# Patient Record
Sex: Female | Born: 1975 | Race: White | Hispanic: No | Marital: Single | Smoking: Never smoker
Health system: Southern US, Community
[De-identification: ages and names within clinical notes are randomized; demographics above are authoritative.]

## PROBLEM LIST (undated history)

## (undated) DIAGNOSIS — Z5189 Encounter for other specified aftercare: Secondary | ICD-10-CM

## (undated) DIAGNOSIS — E119 Type 2 diabetes mellitus without complications: Secondary | ICD-10-CM

## (undated) DIAGNOSIS — R011 Cardiac murmur, unspecified: Secondary | ICD-10-CM

## (undated) DIAGNOSIS — N186 End stage renal disease: Secondary | ICD-10-CM

## (undated) DIAGNOSIS — H269 Unspecified cataract: Secondary | ICD-10-CM

## (undated) DIAGNOSIS — D649 Anemia, unspecified: Secondary | ICD-10-CM

## (undated) DIAGNOSIS — F419 Anxiety disorder, unspecified: Secondary | ICD-10-CM

## (undated) DIAGNOSIS — T7840XA Allergy, unspecified, initial encounter: Secondary | ICD-10-CM

## (undated) DIAGNOSIS — R569 Unspecified convulsions: Secondary | ICD-10-CM

## (undated) DIAGNOSIS — Z992 Dependence on renal dialysis: Secondary | ICD-10-CM

## (undated) DIAGNOSIS — I10 Essential (primary) hypertension: Secondary | ICD-10-CM

## (undated) HISTORY — DX: Unspecified cataract: H26.9

## (undated) HISTORY — DX: Type 2 diabetes mellitus without complications: E11.9

## (undated) HISTORY — DX: Unspecified convulsions: R56.9

## (undated) HISTORY — DX: Cardiac murmur, unspecified: R01.1

## (undated) HISTORY — DX: Anxiety disorder, unspecified: F41.9

## (undated) HISTORY — DX: Allergy, unspecified, initial encounter: T78.40XA

---

## 1997-03-18 HISTORY — PX: OTHER SURGICAL HISTORY: SHX169

## 1999-03-19 HISTORY — PX: FEMUR SURGERY: SHX943

## 2004-02-02 ENCOUNTER — Emergency Department (HOSPITAL_COMMUNITY): Admission: EM | Admit: 2004-02-02 | Discharge: 2004-02-02 | Payer: Self-pay | Admitting: Emergency Medicine

## 2004-03-18 HISTORY — PX: WRIST SURGERY: SHX841

## 2004-09-07 ENCOUNTER — Ambulatory Visit: Payer: Self-pay | Admitting: Orthopaedic Surgery

## 2005-08-10 ENCOUNTER — Emergency Department: Payer: Self-pay | Admitting: Emergency Medicine

## 2006-05-24 ENCOUNTER — Emergency Department: Payer: Self-pay

## 2006-05-24 ENCOUNTER — Other Ambulatory Visit: Payer: Self-pay

## 2006-09-04 ENCOUNTER — Ambulatory Visit: Payer: Self-pay | Admitting: Gynecology

## 2006-09-09 ENCOUNTER — Ambulatory Visit: Payer: Self-pay | Admitting: Family Medicine

## 2006-09-09 ENCOUNTER — Encounter: Payer: Self-pay | Admitting: Family Medicine

## 2006-09-15 ENCOUNTER — Ambulatory Visit (HOSPITAL_COMMUNITY): Admission: RE | Admit: 2006-09-15 | Discharge: 2006-09-15 | Payer: Self-pay | Admitting: Family Medicine

## 2006-09-22 ENCOUNTER — Ambulatory Visit: Payer: Self-pay | Admitting: Gynecology

## 2006-09-22 ENCOUNTER — Encounter: Admission: RE | Admit: 2006-09-22 | Discharge: 2006-09-22 | Payer: Self-pay | Admitting: Family Medicine

## 2006-09-29 ENCOUNTER — Ambulatory Visit (HOSPITAL_COMMUNITY): Admission: RE | Admit: 2006-09-29 | Discharge: 2006-09-29 | Payer: Self-pay | Admitting: Family Medicine

## 2006-09-30 ENCOUNTER — Ambulatory Visit: Payer: Self-pay | Admitting: Family Medicine

## 2006-10-06 ENCOUNTER — Ambulatory Visit: Payer: Self-pay | Admitting: Gynecology

## 2006-10-20 ENCOUNTER — Ambulatory Visit (HOSPITAL_COMMUNITY): Admission: RE | Admit: 2006-10-20 | Discharge: 2006-10-20 | Payer: Self-pay | Admitting: Family Medicine

## 2006-10-27 ENCOUNTER — Ambulatory Visit: Payer: Self-pay | Admitting: Gynecology

## 2006-11-10 ENCOUNTER — Ambulatory Visit: Payer: Self-pay | Admitting: Gynecology

## 2006-11-24 ENCOUNTER — Ambulatory Visit (HOSPITAL_COMMUNITY): Admission: RE | Admit: 2006-11-24 | Discharge: 2006-11-24 | Payer: Self-pay | Admitting: Family Medicine

## 2006-11-25 ENCOUNTER — Ambulatory Visit: Payer: Self-pay | Admitting: Family Medicine

## 2006-12-01 ENCOUNTER — Ambulatory Visit: Payer: Self-pay | Admitting: Gynecology

## 2006-12-15 ENCOUNTER — Ambulatory Visit (HOSPITAL_COMMUNITY): Admission: RE | Admit: 2006-12-15 | Discharge: 2006-12-15 | Payer: Self-pay | Admitting: Family Medicine

## 2006-12-24 ENCOUNTER — Ambulatory Visit: Payer: Self-pay | Admitting: Obstetrics & Gynecology

## 2007-01-01 ENCOUNTER — Ambulatory Visit: Payer: Self-pay | Admitting: Family Medicine

## 2007-01-06 ENCOUNTER — Ambulatory Visit: Payer: Self-pay | Admitting: Family Medicine

## 2007-01-12 ENCOUNTER — Ambulatory Visit: Payer: Self-pay | Admitting: Gynecology

## 2007-01-14 ENCOUNTER — Ambulatory Visit: Payer: Self-pay | Admitting: Obstetrics & Gynecology

## 2007-01-20 ENCOUNTER — Ambulatory Visit (HOSPITAL_COMMUNITY): Admission: RE | Admit: 2007-01-20 | Discharge: 2007-01-20 | Payer: Self-pay | Admitting: Family Medicine

## 2007-01-22 ENCOUNTER — Inpatient Hospital Stay (HOSPITAL_COMMUNITY): Admission: AD | Admit: 2007-01-22 | Discharge: 2007-01-28 | Payer: Self-pay | Admitting: Gynecology

## 2007-01-22 ENCOUNTER — Ambulatory Visit: Payer: Self-pay | Admitting: *Deleted

## 2007-01-29 ENCOUNTER — Ambulatory Visit: Payer: Self-pay | Admitting: Obstetrics & Gynecology

## 2007-02-02 ENCOUNTER — Ambulatory Visit: Payer: Self-pay | Admitting: Obstetrics & Gynecology

## 2007-02-05 ENCOUNTER — Ambulatory Visit: Payer: Self-pay | Admitting: Obstetrics & Gynecology

## 2007-02-10 ENCOUNTER — Ambulatory Visit (HOSPITAL_COMMUNITY): Admission: RE | Admit: 2007-02-10 | Discharge: 2007-02-10 | Payer: Self-pay | Admitting: Gynecology

## 2007-02-11 ENCOUNTER — Ambulatory Visit: Payer: Self-pay | Admitting: Obstetrics & Gynecology

## 2007-02-17 ENCOUNTER — Ambulatory Visit: Payer: Self-pay | Admitting: Gynecology

## 2007-02-19 ENCOUNTER — Ambulatory Visit: Payer: Self-pay | Admitting: Obstetrics & Gynecology

## 2007-02-20 ENCOUNTER — Inpatient Hospital Stay (HOSPITAL_COMMUNITY): Admission: AD | Admit: 2007-02-20 | Discharge: 2007-02-27 | Payer: Self-pay | Admitting: Obstetrics & Gynecology

## 2007-02-20 ENCOUNTER — Ambulatory Visit: Payer: Self-pay | Admitting: Pulmonary Disease

## 2007-02-20 ENCOUNTER — Ambulatory Visit: Payer: Self-pay | Admitting: Cardiology

## 2007-02-20 ENCOUNTER — Ambulatory Visit: Payer: Self-pay | Admitting: Obstetrics & Gynecology

## 2007-02-21 ENCOUNTER — Encounter: Payer: Self-pay | Admitting: Obstetrics & Gynecology

## 2007-02-25 ENCOUNTER — Encounter: Payer: Self-pay | Admitting: Obstetrics & Gynecology

## 2007-03-19 HISTORY — PX: ELBOW SURGERY: SHX618

## 2007-04-08 ENCOUNTER — Ambulatory Visit: Payer: Self-pay | Admitting: Obstetrics & Gynecology

## 2007-04-08 ENCOUNTER — Encounter (INDEPENDENT_AMBULATORY_CARE_PROVIDER_SITE_OTHER): Payer: Self-pay | Admitting: Gynecology

## 2007-04-09 ENCOUNTER — Ambulatory Visit: Payer: Self-pay | Admitting: Obstetrics & Gynecology

## 2007-04-24 ENCOUNTER — Encounter: Payer: Self-pay | Admitting: Pulmonary Disease

## 2007-04-24 DIAGNOSIS — J189 Pneumonia, unspecified organism: Secondary | ICD-10-CM | POA: Insufficient documentation

## 2007-06-25 ENCOUNTER — Ambulatory Visit: Payer: Self-pay | Admitting: Nurse Practitioner

## 2007-08-19 ENCOUNTER — Ambulatory Visit: Payer: Self-pay | Admitting: Family Medicine

## 2008-01-29 ENCOUNTER — Ambulatory Visit: Payer: Self-pay | Admitting: Unknown Physician Specialty

## 2008-02-04 ENCOUNTER — Ambulatory Visit: Payer: Self-pay | Admitting: Unknown Physician Specialty

## 2008-05-13 ENCOUNTER — Ambulatory Visit: Payer: Self-pay | Admitting: Family Medicine

## 2009-01-27 ENCOUNTER — Inpatient Hospital Stay: Payer: Self-pay | Admitting: Internal Medicine

## 2009-03-18 HISTORY — PX: OTHER SURGICAL HISTORY: SHX169

## 2010-01-15 ENCOUNTER — Encounter: Admission: RE | Admit: 2010-01-15 | Discharge: 2010-01-15 | Payer: Self-pay | Admitting: Family Medicine

## 2010-03-18 HISTORY — PX: OTHER SURGICAL HISTORY: SHX169

## 2010-03-18 HISTORY — PX: REMOVAL OF A DIALYSIS CATHETER: SHX6053

## 2010-04-04 ENCOUNTER — Ambulatory Visit: Admit: 2010-04-04 | Payer: Self-pay | Admitting: Obstetrics & Gynecology

## 2010-04-07 ENCOUNTER — Encounter: Payer: Self-pay | Admitting: Family Medicine

## 2010-04-12 ENCOUNTER — Ambulatory Visit: Admit: 2010-04-12 | Payer: Self-pay | Admitting: Obstetrics & Gynecology

## 2010-06-18 ENCOUNTER — Encounter (HOSPITAL_BASED_OUTPATIENT_CLINIC_OR_DEPARTMENT_OTHER): Payer: BC Managed Care – PPO | Attending: Internal Medicine

## 2010-06-18 DIAGNOSIS — Z992 Dependence on renal dialysis: Secondary | ICD-10-CM | POA: Insufficient documentation

## 2010-06-18 DIAGNOSIS — E1142 Type 2 diabetes mellitus with diabetic polyneuropathy: Secondary | ICD-10-CM | POA: Insufficient documentation

## 2010-06-18 DIAGNOSIS — E1069 Type 1 diabetes mellitus with other specified complication: Secondary | ICD-10-CM | POA: Insufficient documentation

## 2010-06-18 DIAGNOSIS — E11319 Type 2 diabetes mellitus with unspecified diabetic retinopathy without macular edema: Secondary | ICD-10-CM | POA: Insufficient documentation

## 2010-06-18 DIAGNOSIS — L97509 Non-pressure chronic ulcer of other part of unspecified foot with unspecified severity: Secondary | ICD-10-CM | POA: Insufficient documentation

## 2010-06-18 DIAGNOSIS — E1039 Type 1 diabetes mellitus with other diabetic ophthalmic complication: Secondary | ICD-10-CM | POA: Insufficient documentation

## 2010-06-18 DIAGNOSIS — E1029 Type 1 diabetes mellitus with other diabetic kidney complication: Secondary | ICD-10-CM | POA: Insufficient documentation

## 2010-06-18 DIAGNOSIS — N19 Unspecified kidney failure: Secondary | ICD-10-CM | POA: Insufficient documentation

## 2010-06-18 DIAGNOSIS — Z79899 Other long term (current) drug therapy: Secondary | ICD-10-CM | POA: Insufficient documentation

## 2010-06-18 DIAGNOSIS — E1049 Type 1 diabetes mellitus with other diabetic neurological complication: Secondary | ICD-10-CM | POA: Insufficient documentation

## 2010-06-18 DIAGNOSIS — Z794 Long term (current) use of insulin: Secondary | ICD-10-CM | POA: Insufficient documentation

## 2010-06-21 ENCOUNTER — Encounter (INDEPENDENT_AMBULATORY_CARE_PROVIDER_SITE_OTHER): Payer: BC Managed Care – PPO

## 2010-06-21 DIAGNOSIS — L97909 Non-pressure chronic ulcer of unspecified part of unspecified lower leg with unspecified severity: Secondary | ICD-10-CM

## 2010-07-02 ENCOUNTER — Other Ambulatory Visit (HOSPITAL_BASED_OUTPATIENT_CLINIC_OR_DEPARTMENT_OTHER): Payer: Self-pay | Admitting: Internal Medicine

## 2010-07-02 ENCOUNTER — Ambulatory Visit (HOSPITAL_COMMUNITY)
Admission: RE | Admit: 2010-07-02 | Discharge: 2010-07-02 | Disposition: A | Discharge status: Home / Self Care | Payer: BC Managed Care – PPO | Source: Ambulatory Visit | Attending: Internal Medicine | Admitting: Internal Medicine

## 2010-07-02 DIAGNOSIS — M869 Osteomyelitis, unspecified: Secondary | ICD-10-CM

## 2010-07-02 DIAGNOSIS — E119 Type 2 diabetes mellitus without complications: Secondary | ICD-10-CM | POA: Insufficient documentation

## 2010-07-02 DIAGNOSIS — M899 Disorder of bone, unspecified: Secondary | ICD-10-CM | POA: Insufficient documentation

## 2010-07-02 DIAGNOSIS — M7989 Other specified soft tissue disorders: Secondary | ICD-10-CM | POA: Insufficient documentation

## 2010-07-02 DIAGNOSIS — X58XXXA Exposure to other specified factors, initial encounter: Secondary | ICD-10-CM | POA: Insufficient documentation

## 2010-07-02 DIAGNOSIS — J984 Other disorders of lung: Secondary | ICD-10-CM | POA: Insufficient documentation

## 2010-07-02 DIAGNOSIS — I10 Essential (primary) hypertension: Secondary | ICD-10-CM | POA: Insufficient documentation

## 2010-07-02 DIAGNOSIS — S91109A Unspecified open wound of unspecified toe(s) without damage to nail, initial encounter: Secondary | ICD-10-CM | POA: Insufficient documentation

## 2010-07-05 ENCOUNTER — Other Ambulatory Visit (HOSPITAL_BASED_OUTPATIENT_CLINIC_OR_DEPARTMENT_OTHER): Payer: Self-pay | Admitting: Internal Medicine

## 2010-07-09 ENCOUNTER — Other Ambulatory Visit (HOSPITAL_BASED_OUTPATIENT_CLINIC_OR_DEPARTMENT_OTHER): Payer: Self-pay | Admitting: Internal Medicine

## 2010-07-09 LAB — GLUCOSE, CAPILLARY
Glucose-Capillary: 135 mg/dL — ABNORMAL HIGH (ref 70–99)
Glucose-Capillary: 267 mg/dL — ABNORMAL HIGH (ref 70–99)

## 2010-07-10 ENCOUNTER — Other Ambulatory Visit (HOSPITAL_BASED_OUTPATIENT_CLINIC_OR_DEPARTMENT_OTHER): Payer: Self-pay | Admitting: Internal Medicine

## 2010-07-10 LAB — GLUCOSE, CAPILLARY: Glucose-Capillary: 265 mg/dL — ABNORMAL HIGH (ref 70–99)

## 2010-07-13 ENCOUNTER — Other Ambulatory Visit (HOSPITAL_BASED_OUTPATIENT_CLINIC_OR_DEPARTMENT_OTHER): Payer: Self-pay | Admitting: Internal Medicine

## 2010-07-16 LAB — GLUCOSE, CAPILLARY
Glucose-Capillary: 296 mg/dL — ABNORMAL HIGH (ref 70–99)
Glucose-Capillary: 217 mg/dL — ABNORMAL HIGH (ref 70–99)

## 2010-07-17 ENCOUNTER — Encounter (HOSPITAL_BASED_OUTPATIENT_CLINIC_OR_DEPARTMENT_OTHER): Payer: BC Managed Care – PPO | Attending: Internal Medicine

## 2010-07-17 DIAGNOSIS — E1069 Type 1 diabetes mellitus with other specified complication: Secondary | ICD-10-CM | POA: Insufficient documentation

## 2010-07-17 DIAGNOSIS — E1049 Type 1 diabetes mellitus with other diabetic neurological complication: Secondary | ICD-10-CM | POA: Insufficient documentation

## 2010-07-17 DIAGNOSIS — E1142 Type 2 diabetes mellitus with diabetic polyneuropathy: Secondary | ICD-10-CM | POA: Insufficient documentation

## 2010-07-17 DIAGNOSIS — E1029 Type 1 diabetes mellitus with other diabetic kidney complication: Secondary | ICD-10-CM | POA: Insufficient documentation

## 2010-07-17 DIAGNOSIS — N19 Unspecified kidney failure: Secondary | ICD-10-CM | POA: Insufficient documentation

## 2010-07-17 DIAGNOSIS — Z992 Dependence on renal dialysis: Secondary | ICD-10-CM | POA: Insufficient documentation

## 2010-07-17 DIAGNOSIS — L97509 Non-pressure chronic ulcer of other part of unspecified foot with unspecified severity: Secondary | ICD-10-CM | POA: Insufficient documentation

## 2010-07-17 DIAGNOSIS — E11319 Type 2 diabetes mellitus with unspecified diabetic retinopathy without macular edema: Secondary | ICD-10-CM | POA: Insufficient documentation

## 2010-07-17 DIAGNOSIS — E1039 Type 1 diabetes mellitus with other diabetic ophthalmic complication: Secondary | ICD-10-CM | POA: Insufficient documentation

## 2010-07-17 DIAGNOSIS — Z794 Long term (current) use of insulin: Secondary | ICD-10-CM | POA: Insufficient documentation

## 2010-07-17 DIAGNOSIS — Z79899 Other long term (current) drug therapy: Secondary | ICD-10-CM | POA: Insufficient documentation

## 2010-07-24 ENCOUNTER — Other Ambulatory Visit (HOSPITAL_BASED_OUTPATIENT_CLINIC_OR_DEPARTMENT_OTHER): Payer: Self-pay | Admitting: Internal Medicine

## 2010-07-25 ENCOUNTER — Other Ambulatory Visit (HOSPITAL_BASED_OUTPATIENT_CLINIC_OR_DEPARTMENT_OTHER): Payer: Self-pay | Admitting: Internal Medicine

## 2010-07-25 LAB — GLUCOSE, CAPILLARY: Glucose-Capillary: 205 mg/dL — ABNORMAL HIGH (ref 70–99)

## 2010-07-31 NOTE — Discharge Summary (Signed)
NAMEKIRSTYN, [name]               ACCOUNT NO.:  [id number]   MEDICAL RECORD NO.:  [id number]          PATIENT TYPE:  INP   LOCATION:  9149                          FACILITY:  [facility]   PHYSICIAN:  Allie Bossier, MD        DATE OF BIRTH:  Mar 06, 1976   DATE OF ADMISSION:  01/22/2007  DATE OF DISCHARGE:  01/28/2007                               DISCHARGE SUMMARY   ADMISSION DIAGNOSIS:  1. Intrauterine pregnancy at 25 weeks 1 day.  2. Class F/R insulin dependent diabetes.  3. Proteinuria.  4. Elevated blood pressures.  5. Seizure disorder.  6. Gastroesophageal reflux disease.   DISCHARGE DIAGNOSES:  1. Intrauterine pregnancy at 25 weeks 6 days gestation.  2. Class F/R insulin-dependent diabetes mellitus.  3. Mild pre-eclampsia.  4. Seizure disorder.  5. Gastroesophageal reflux disease.   PROCEDURES:  None.   CONSULTATIONS:  1. Maternal fetal medicine, Dr. Rica Koyanagi.  2. Nutrition and diabetes education.   COMPLICATIONS:  None.   PERTINENT LABORATORY FINDINGS:  Patient was evaluated upon admission  with PIH labs, showing a hemoglobin of 12.2, platelets 402.  She had  LFTs showing AST of 46, ALT of 38.  She had an initial LDH of 170, a  uric acid of 4.5.  She had a 24-hour urine protein performed at  admission, which showed urine protein of 1418, urine volume of 1125.  This was repeated on hospital day #3 and at that time, she had urine  volume of 900, creatinine clearance of 102, urine creatinine of 1046 and  24-hour urine protein of 1143.  She also had a 24 hour urine calcium  performed that was 18.  PIH labs were performed daily throughout her  stay with a drop in her hemoglobin to 10.1 on date of discharge.  Platelets were 348.  ALT and AST had trended down to AST of 28 and an  ALT of 27, but elevated again to levels of AST of 55 and ALT of 41.  LDH  was 149 and uric acid was 4.5.   BRIEF PERTINENT ADMISSION HISTORY:  This is a 36 year old gravida 2,  para 0-0-2-0 with  Class F/R insulin-dependent diabetes mellitus,  presenting at 25 weeks 0 days gestation.  She was noted to have an  elevated 24-hour urine with protein of 1877.  This was elevated from a  baseline of 580.  She was also noted to have elevated blood pressures in  the 140 systolic range and 80-90 diastolic range.   HOSPITAL COURSE:  The patient was admitted to be evaluated for pre-  eclampsia.  Her initial labs showed mildly elevated liver function  tests, but otherwise her PIH labs were normal.  She did have a 24-hour  urine that was done initially, showing a 24-hour urine protein of 1418.  Throughout her stay, her blood pressures ranged in the 120s to 150s  range systolic, mostly in the 130s to 140s and 70s to 80s diastolic,  though she did have some in the low 90s diastolic.  She was treated with  dexamethasone times four doses.  While  she was on steroids, her insulin  pump needed to be titrated to a higher dose, due to the hyperglycemic  response of being on steroids.  On the day of discharge, her sugars had  improved to 70-100 fasting, 88-115 postprandial and 148 before bedtime.  Clinically, the patient appeared stable and did not have signs or  symptoms of pre-eclampsia.  She denied headache, vision changes,  abdominal pain, decreased urination or swelling.  Patient had her sugars  monitored closely and stayed an extra day due to this.  Patient was in  stable condition and ready for discharge with the diagnosis of mild pre-  eclampsia.   DISCHARGE STATUS:  Stable.   DISCHARGE MEDICATIONS:  1. Patient is to continue her home medications at current doses,      including her insulin pump, Claritin, Pepcid, Lamictal and prenatal      vitamins.  2. The patient is to start ferrous sulfate 325 mg one p.o. b.i.d. for      hemoglobin that dropped to 9.7 on hospital day #4.  3. Colace 100 mg p.o. daily.   DISCHARGE INSTRUCTIONS:  1. Discharged home.  2. Modified bed rest for activity.   Patient has been instructed that      she may ambulate about the house, but needs to take it easy without      standing for long periods or working.  3. A 2000 calorie American Diabetic Association diet.  The patient did      have discussions about this diet with diabetes education during her      stay.  4. Patient is to follow up at [facility] on January 29, 2007, at 10 a.m.  5. The patient is to follow up at high-risk clinic at the [facility] on February 02, 2007 at 7:30 a.m.  patient is to      have nonstress test at both visits.      Karlton Lemon, MD  Electronically Signed     ______________________________  Allie Bossier, MD    NS/MEDQ  D:  01/28/2007  T:  01/28/2007  Job:  [id number]

## 2010-07-31 NOTE — Assessment & Plan Note (Signed)
NAMELAURICE, IGLESIA               ACCOUNT NO.:  [id number]   MEDICAL RECORD NO.:  [id number]          PATIENT TYPE:  POB   LOCATION:  [facility] at [facility]         FACILITY:  [facility]   PHYSICIAN:  Tinnie Gens, MD        DATE OF BIRTH:  1976/03/15   DATE OF SERVICE:  09/04/2006                                  CLINIC NOTE   Real-time transvaginal ultrasound was done revealing a single  intrauterine pregnancy.  There was a yolk sac noted, fetal pole  measuring 4.4 mm consistent with a 6-week 1-day gestational age and in  [facility] of June 01, 2007.  This was consistent with LMP which gives her an  [facility] of May 06, 2007.  There was no free fluid in the cul-de-sac.  Her cervix measured 4.0 cm.  Total uterine length was 10.1 cm.  The  patient's left ovary measured 2.9 x 2.9 cm and appeared normal.  Her  right ovary measured 2.5 x 1.8 cm and also appeared normal.  There was a  flicker noted.           ______________________________  Tinnie Gens, MD     TP/MEDQ  D:  09/09/2006  T:  09/10/2006  Job:  [id number]

## 2010-07-31 NOTE — Assessment & Plan Note (Signed)
NAME:  [name], [name]               ACCOUNT NO.:  [id number]   MEDICAL RECORD NO.:  [id number]          PATIENT TYPE:  POB   LOCATION:  [facility] at [facility]         FACILITY:  [facility]   PHYSICIAN:  Ginger Carne, MD DATE OF BIRTH:  18-Oct-1975   DATE OF SERVICE:                                  CLINIC NOTE   HISTORY:  The patient returns today following Mirena IUD placement from  April 09, 2007 with a complaint of her IUD string poking her.   Examination demonstrates string that is approximately 3-4 cm protruding  out of the external cervical os.  This was trimmed to the level of the  os.  The patient was apprised of procedure and understands that the  string may migrate into the endocervical canal making it difficult in  the future to visualize said string.           ______________________________  Ginger Carne, MD     SHB/MEDQ  D:  06/25/2007  T:  06/25/2007  Job:  [id number]

## 2010-07-31 NOTE — Op Note (Signed)
NAMECLIFFORD, COUDRIET               ACCOUNT NO.:  [id number]   MEDICAL RECORD NO.:  [id number]          PATIENT TYPE:  INP   LOCATION:                                FACILITY:  [facility]   PHYSICIAN:  Allie Bossier, MD        DATE OF BIRTH:  Apr 17, 1975   DATE OF PROCEDURE:  DATE OF DISCHARGE:                               OPERATIVE REPORT   PREOPERATIVE DIAGNOSES:  1. Twenty-nine and a half weeks with severe preeclampsia/oliguria.  2. Class R/F diabetes mellitus.   POSTOPERATIVE DIAGNOSES:  1. Twenty-nine and a half weeks with severe preeclampsia/oliguria.  2. Class R/F diabetes mellitus.   PROCEDURE:  Primary low transverse cesarean section.   SURGEON:  M C. Marice Potter, MD   ANESTHESIA:  Spinal.   ANESTHESIOLOGIST:  Burnett Corrente, MD   COMPLICATIONS:  None.   ESTIMATED BLOOD LOSS:  600 mL.   SPECIMENS:  Placenta, cord blood and cord blood gas.   FINDINGS:  1. Living female infant, Apgars 5 and 7 at 1 and 5 minutes, weight 3      pounds 10 ounces.  2. Intact placenta with 3-vessel cord.  3. Normal pelvic anatomy.   DETAILED PROCEDURE AND FINDINGS:  The risks, benefits and alternatives  of surgery first explained, understood and accepted.  Consents were  signed.  She was taken to the operating room and spinal anesthesia was  applied without complication.  She was placed in a dorsal supine  position with a left lateral tilt.  Her abdomen was prepped and draped  in the usual sterile fashion.  Her Foley catheter drained clear urine  throughout the case, although it was noted to be very concentrated and  of a limited amount.  Adequate anesthesia was assured and a transverse  incision was made approximately 2 cm above the symphysis pubis.  Incision was carried down through the subcutaneous tissue to the fascia.  Fascia was scored in the midline.  The fascial incision was extended  bilaterally.  The rectus muscles were partially separated in the midline  using electrosurgical technique  (approximately 50% on each side).  The  peritoneum was entered with hemostats.  Peritoneal incision was extended  bilaterally and curved slightly upwards, taking care to avoid the  bladder.  A bladder blade was placed.  A bladder flap was created.  The  uterine incision was made in a transverse fashion in the lower uterine  segment.  Amniotomy was performed with hemostats.  A large amount of  clear fluid was noted.  The uterine incision was extended bilaterally  and curved slightly upward with bandage scissors.  The baby was  delivered from a vertex presentation; its mouth and nostrils were  suctioned.  The baby was transferred to the NICU physician for care.  Its weight and Apgars are listed above.  Cord blood gas was obtained.  Cord blood sample was obtained.  The placenta was extracted with  traction and noted to be intact.  The uterus was left in situ.  The  uterine interior was cleaned with a dry lap sponge.  A bladder blade was  placed.  The uterine incision was closed with 2 layers of 0 chromic  running-locking suture, the second layer imbricating the first;  excellent hemostasis was noted.  I palpated both adnexa and all appeared  normal.  The uterine incision was again inspected and noted to be  hemostatic, as were the rectus muscles and rectus fascia.  The fascia  was closed with 0 Vicryl running nonlocking suture.  No defects were  palpable.  The subcutaneous tissue was irrigated, cleaned and dried.  It  was then infiltrated with a total of 30 mL of 0.5% Marcaine.  A  subcuticular closure was done with 3-0 Vicryl suture.  Steri-Strips were  placed on the skin.  She was taken to the recovery room in stable  condition.  She tolerated the procedure well.      Allie Bossier, MD  Electronically Signed     MCD/MEDQ  D:  02/21/2007  T:  02/23/2007  Job:  [id number]

## 2010-07-31 NOTE — Discharge Summary (Signed)
[name], CARINO               ACCOUNT NO.:  [id number]   MEDICAL RECORD NO.:  [id number]          PATIENT TYPE:  INP   LOCATION:  9303                          FACILITY:  [facility]   PHYSICIAN:  Norton Blizzard, MD    DATE OF BIRTH:  07-17-1975   DATE OF ADMISSION:  02/20/2007  DATE OF DISCHARGE:  02/27/2007                               DISCHARGE SUMMARY   DISCHARGE DIAGNOSES:  1. Intrauterine pregnancy, delivered preterm, via primary low      transverse cesarean section.  2. Severe pre-eclampsia.  3. Eclampsia, versus postpartum seizure, in patient with known seizure      disorder.  4. Aspiration pneumonia.   DISCHARGE MEDICATIONS:  1. Amoxicillin 500 mg t.i.d. for 7 days.  2. Colace 100 mg daily.  3. Ferrous sulfate 325 mg t.i.d.  4. Ibuprofen 600 mg one tab q. 6 hours p.r.n. pain.  5. Percocet 5/325 mg one to two tabs q. 4 hours p.r.n. pain.  6. Prenatal vitamins while breast-feeding or pumping.  7. Insulin pump per home regimen.  8. Labetalol 200 mg b.i.d.  9. Lamictal 200 mg q.h.s.   PROCEDURES DURING HOSPITAL STAY:  1. Primary low transverse cesarean section, uncomplicated.  2. Echocardiogram, showing normal ejection fraction of 55-60%.   HOSPITAL COURSE:  Patient was a G3, P0 at 29-3/[redacted] weeks gestational age  with class R/F diabetes mellitus and known seizure disorder, who was  admitted for severe pre-eclampsia with oliguria.  Upon admission and  observation, it was decided patient needed to be delivered.  She was  offered induction of labor versus primary low transverse cesarean  section.  She opted for the C-section, knowing risks and benefits of  both.  The  cesarean section was uncomplicated.  A viable female was  delivered with Apgars of 5 at one minute and 7 at five minutes.  Weight  was 3 pounds 10 ounces.  A three-vessel cord placenta was manually  delivered and estimated blood loss over the procedure was 600 mL.  Baby  was passed off to NICU for further  care.   Approximately four hours after delivery, the patient started to seize.  She was given 1 mg Ativan and also started on magnesium sulfate and was  cared for in the ICU.  She was found to have pulmonary edema and was  started on Lasix.  Magnesium was discontinued on December 9.  Lasix was  discontinued on December 11.  Pulmonary consult was obtained, secondary  to persistent pulmonary symptoms.  She was diagnosed with aspiration  pneumonia and started on amoxicillin 500 mg t.i.d. for seven days and  advised to follow up in the pulmonology clinic in four weeks.   Patient was stable at the time of discharge.   DISCHARGE CONDITION:  Stable.   FOLLOWUP:  Patient should be seen for her postpartum check at [facility] in six weeks and follow up sooner if needed, and patient should  follow up in the [facility] in four weeks.  This  information and numbers to make appointments were given to the patient  at  the time of discharge.      Ardeen Garland, MD      Norton Blizzard, MD  Electronically Signed    LM/MEDQ  D:  02/27/2007  T:  02/27/2007  Job:  [id number]

## 2010-08-01 ENCOUNTER — Other Ambulatory Visit (HOSPITAL_BASED_OUTPATIENT_CLINIC_OR_DEPARTMENT_OTHER): Payer: Self-pay | Admitting: Internal Medicine

## 2010-08-01 LAB — GLUCOSE, CAPILLARY: Glucose-Capillary: 342 mg/dL — ABNORMAL HIGH (ref 70–99)

## 2010-08-02 ENCOUNTER — Other Ambulatory Visit (HOSPITAL_BASED_OUTPATIENT_CLINIC_OR_DEPARTMENT_OTHER): Payer: Self-pay | Admitting: Internal Medicine

## 2010-08-02 LAB — GLUCOSE, CAPILLARY
Glucose-Capillary: 285 mg/dL — ABNORMAL HIGH (ref 70–99)
Glucose-Capillary: 242 mg/dL — ABNORMAL HIGH (ref 70–99)

## 2010-08-03 ENCOUNTER — Other Ambulatory Visit (HOSPITAL_BASED_OUTPATIENT_CLINIC_OR_DEPARTMENT_OTHER): Payer: Self-pay | Admitting: Internal Medicine

## 2010-08-03 LAB — GLUCOSE, CAPILLARY: Glucose-Capillary: 332 mg/dL — ABNORMAL HIGH (ref 70–99)

## 2010-08-15 ENCOUNTER — Other Ambulatory Visit (HOSPITAL_BASED_OUTPATIENT_CLINIC_OR_DEPARTMENT_OTHER): Payer: Self-pay | Admitting: Internal Medicine

## 2010-08-15 LAB — GLUCOSE, CAPILLARY: Glucose-Capillary: 249 mg/dL — ABNORMAL HIGH (ref 70–99)

## 2010-08-16 ENCOUNTER — Other Ambulatory Visit (HOSPITAL_BASED_OUTPATIENT_CLINIC_OR_DEPARTMENT_OTHER): Payer: Self-pay | Admitting: Internal Medicine

## 2010-08-17 ENCOUNTER — Other Ambulatory Visit (HOSPITAL_BASED_OUTPATIENT_CLINIC_OR_DEPARTMENT_OTHER): Payer: Self-pay | Admitting: Internal Medicine

## 2010-08-17 ENCOUNTER — Encounter (HOSPITAL_BASED_OUTPATIENT_CLINIC_OR_DEPARTMENT_OTHER): Payer: BC Managed Care – PPO | Attending: Internal Medicine

## 2010-08-17 DIAGNOSIS — E1069 Type 1 diabetes mellitus with other specified complication: Secondary | ICD-10-CM | POA: Insufficient documentation

## 2010-08-17 DIAGNOSIS — L97509 Non-pressure chronic ulcer of other part of unspecified foot with unspecified severity: Secondary | ICD-10-CM | POA: Insufficient documentation

## 2010-08-17 LAB — GLUCOSE, CAPILLARY: Glucose-Capillary: 137 mg/dL — ABNORMAL HIGH (ref 70–99)

## 2010-08-20 ENCOUNTER — Other Ambulatory Visit (HOSPITAL_BASED_OUTPATIENT_CLINIC_OR_DEPARTMENT_OTHER): Payer: Self-pay | Admitting: Internal Medicine

## 2010-08-20 LAB — GLUCOSE, CAPILLARY
Glucose-Capillary: 287 mg/dL — ABNORMAL HIGH (ref 70–99)
Glucose-Capillary: 320 mg/dL — ABNORMAL HIGH (ref 70–99)

## 2010-08-21 ENCOUNTER — Other Ambulatory Visit (HOSPITAL_BASED_OUTPATIENT_CLINIC_OR_DEPARTMENT_OTHER): Payer: Self-pay | Admitting: Internal Medicine

## 2010-08-21 LAB — GLUCOSE, CAPILLARY: Glucose-Capillary: 223 mg/dL — ABNORMAL HIGH (ref 70–99)

## 2010-08-22 ENCOUNTER — Other Ambulatory Visit (HOSPITAL_BASED_OUTPATIENT_CLINIC_OR_DEPARTMENT_OTHER): Payer: Self-pay | Admitting: Internal Medicine

## 2010-08-22 LAB — GLUCOSE, CAPILLARY: Glucose-Capillary: 176 mg/dL — ABNORMAL HIGH (ref 70–99)

## 2010-08-23 ENCOUNTER — Other Ambulatory Visit (HOSPITAL_BASED_OUTPATIENT_CLINIC_OR_DEPARTMENT_OTHER): Payer: Self-pay | Admitting: Internal Medicine

## 2010-08-24 ENCOUNTER — Other Ambulatory Visit (HOSPITAL_BASED_OUTPATIENT_CLINIC_OR_DEPARTMENT_OTHER): Payer: Self-pay | Admitting: Internal Medicine

## 2010-08-24 LAB — GLUCOSE, CAPILLARY
Glucose-Capillary: 182 mg/dL — ABNORMAL HIGH (ref 70–99)
Glucose-Capillary: 129 mg/dL — ABNORMAL HIGH (ref 70–99)

## 2010-08-28 ENCOUNTER — Other Ambulatory Visit (HOSPITAL_BASED_OUTPATIENT_CLINIC_OR_DEPARTMENT_OTHER): Payer: Self-pay | Admitting: Internal Medicine

## 2010-08-28 LAB — GLUCOSE, CAPILLARY: Glucose-Capillary: 426 mg/dL — ABNORMAL HIGH (ref 70–99)

## 2010-08-29 ENCOUNTER — Other Ambulatory Visit (HOSPITAL_BASED_OUTPATIENT_CLINIC_OR_DEPARTMENT_OTHER): Payer: Self-pay | Admitting: Internal Medicine

## 2010-08-31 ENCOUNTER — Other Ambulatory Visit (HOSPITAL_BASED_OUTPATIENT_CLINIC_OR_DEPARTMENT_OTHER): Payer: Self-pay | Admitting: Internal Medicine

## 2010-08-31 LAB — GLUCOSE, CAPILLARY: Glucose-Capillary: 187 mg/dL — ABNORMAL HIGH (ref 70–99)

## 2010-09-03 ENCOUNTER — Other Ambulatory Visit (HOSPITAL_BASED_OUTPATIENT_CLINIC_OR_DEPARTMENT_OTHER): Payer: Self-pay | Admitting: Internal Medicine

## 2010-09-03 LAB — GLUCOSE, CAPILLARY
Glucose-Capillary: 222 mg/dL — ABNORMAL HIGH (ref 70–99)
Glucose-Capillary: 314 mg/dL — ABNORMAL HIGH (ref 70–99)

## 2010-09-04 ENCOUNTER — Other Ambulatory Visit (HOSPITAL_BASED_OUTPATIENT_CLINIC_OR_DEPARTMENT_OTHER): Payer: Self-pay | Admitting: Internal Medicine

## 2010-09-04 LAB — GLUCOSE, CAPILLARY
Glucose-Capillary: 181 mg/dL — ABNORMAL HIGH (ref 70–99)
Glucose-Capillary: 203 mg/dL — ABNORMAL HIGH (ref 70–99)

## 2010-09-05 ENCOUNTER — Other Ambulatory Visit (HOSPITAL_BASED_OUTPATIENT_CLINIC_OR_DEPARTMENT_OTHER): Payer: Self-pay | Admitting: Internal Medicine

## 2010-09-06 ENCOUNTER — Other Ambulatory Visit (HOSPITAL_BASED_OUTPATIENT_CLINIC_OR_DEPARTMENT_OTHER): Payer: Self-pay | Admitting: Internal Medicine

## 2010-09-10 ENCOUNTER — Other Ambulatory Visit (HOSPITAL_BASED_OUTPATIENT_CLINIC_OR_DEPARTMENT_OTHER): Payer: Self-pay | Admitting: Internal Medicine

## 2010-09-10 LAB — GLUCOSE, CAPILLARY: Glucose-Capillary: 217 mg/dL — ABNORMAL HIGH (ref 70–99)

## 2010-09-11 ENCOUNTER — Other Ambulatory Visit (HOSPITAL_BASED_OUTPATIENT_CLINIC_OR_DEPARTMENT_OTHER): Payer: Self-pay | Admitting: Internal Medicine

## 2010-09-11 LAB — GLUCOSE, CAPILLARY
Glucose-Capillary: 187 mg/dL — ABNORMAL HIGH (ref 70–99)
Glucose-Capillary: 337 mg/dL — ABNORMAL HIGH (ref 70–99)

## 2010-09-12 ENCOUNTER — Other Ambulatory Visit (HOSPITAL_BASED_OUTPATIENT_CLINIC_OR_DEPARTMENT_OTHER): Payer: Self-pay | Admitting: Internal Medicine

## 2010-09-12 LAB — GLUCOSE, CAPILLARY
Glucose-Capillary: 169 mg/dL — ABNORMAL HIGH (ref 70–99)
Glucose-Capillary: 177 mg/dL — ABNORMAL HIGH (ref 70–99)

## 2010-09-13 ENCOUNTER — Other Ambulatory Visit (HOSPITAL_BASED_OUTPATIENT_CLINIC_OR_DEPARTMENT_OTHER): Payer: Self-pay | Admitting: Internal Medicine

## 2010-09-13 LAB — GLUCOSE, CAPILLARY: Glucose-Capillary: 342 mg/dL — ABNORMAL HIGH (ref 70–99)

## 2010-09-21 ENCOUNTER — Encounter (HOSPITAL_BASED_OUTPATIENT_CLINIC_OR_DEPARTMENT_OTHER): Payer: BC Managed Care – PPO | Attending: Internal Medicine

## 2010-09-21 DIAGNOSIS — E1069 Type 1 diabetes mellitus with other specified complication: Secondary | ICD-10-CM | POA: Insufficient documentation

## 2010-09-21 DIAGNOSIS — L97509 Non-pressure chronic ulcer of other part of unspecified foot with unspecified severity: Secondary | ICD-10-CM | POA: Insufficient documentation

## 2010-12-24 LAB — MAGNESIUM
Magnesium: 4.7 — ABNORMAL HIGH
Magnesium: 4.7 — ABNORMAL HIGH
Magnesium: 4.9 — ABNORMAL HIGH
Magnesium: 5.1 — ABNORMAL HIGH
Magnesium: 5.2 — ABNORMAL HIGH
Magnesium: 5.2 — ABNORMAL HIGH
Magnesium: 5.2 — ABNORMAL HIGH

## 2010-12-24 LAB — CBC
HCT: 24.3 — ABNORMAL LOW
HCT: 25 — ABNORMAL LOW
HCT: 25 — ABNORMAL LOW
HCT: 32.2 — ABNORMAL LOW
HCT: 33.4 — ABNORMAL LOW
Hemoglobin: 11.5 — ABNORMAL LOW
Hemoglobin: 8.4 — ABNORMAL LOW
Hemoglobin: 8.7 — ABNORMAL LOW
MCHC: 34.6
MCHC: 34.7
MCHC: 34.7
MCHC: 35.3
MCV: 96
MCV: 96.1
MCV: 96.7
MCV: 96.8
MCV: 98.1
Platelets: 306
Platelets: 319
Platelets: 322
Platelets: 324
Platelets: 334
Platelets: 357
RBC: 2.58 — ABNORMAL LOW
RBC: 2.59 — ABNORMAL LOW
RBC: 2.64 — ABNORMAL LOW
RBC: 3.34 — ABNORMAL LOW
RBC: 3.35 — ABNORMAL LOW
RDW: 12.8
RDW: 12.8
RDW: 12.9
WBC: 10.1
WBC: 12.3 — ABNORMAL HIGH
WBC: 8.8
WBC: 8.9

## 2010-12-24 LAB — LACTATE DEHYDROGENASE: LDH: 253 — ABNORMAL HIGH

## 2010-12-24 LAB — BASIC METABOLIC PANEL
BUN: 10
BUN: 11
BUN: 11
BUN: 13
CO2: 25
CO2: 29
Calcium: 7.1 — ABNORMAL LOW
Calcium: 7.4 — ABNORMAL LOW
Chloride: 101
Creatinine, Ser: 0.96
Creatinine, Ser: 1.01
Creatinine, Ser: 1.11
GFR calc Af Amer: >60
GFR calc Af Amer: >60
GFR calc Af Amer: >60
GFR calc non Af Amer: 57 — ABNORMAL LOW
GFR calc non Af Amer: >60
GFR calc non Af Amer: >60
Glucose, Bld: 108 — ABNORMAL HIGH
Glucose, Bld: 131 — ABNORMAL HIGH
Potassium: 3.6
Potassium: 3.7
Sodium: 133 — ABNORMAL LOW
Sodium: 136

## 2010-12-24 LAB — COMPREHENSIVE METABOLIC PANEL
ALT: 43 — ABNORMAL HIGH
AST: 49 — ABNORMAL HIGH
AST: 79 — ABNORMAL HIGH
Albumin: 1.8 — ABNORMAL LOW
Albumin: 1.8 — ABNORMAL LOW
Albumin: 1.9 — ABNORMAL LOW
Alkaline Phosphatase: 71
BUN: 13
BUN: 14
CO2: 22
CO2: 25
Calcium: 7 — ABNORMAL LOW
Calcium: 8.3 — ABNORMAL LOW
Chloride: 104
Chloride: 105
Creatinine, Ser: 0.84
Creatinine, Ser: 1.02
Creatinine, Ser: 1.08
GFR calc Af Amer: >60
GFR calc Af Amer: >60
GFR calc non Af Amer: 59 — ABNORMAL LOW
GFR calc non Af Amer: >60
Glucose, Bld: 81
Potassium: 3.6
Potassium: 3.9
Sodium: 136
Total Bilirubin: 0.7
Total Bilirubin: 1.2
Total Protein: 5 — ABNORMAL LOW
Total Protein: 5.1 — ABNORMAL LOW

## 2010-12-24 LAB — STREP B DNA PROBE: Strep Group B Ag: NEGATIVE

## 2010-12-24 LAB — PROTEIN, URINE, 24 HOUR
Protein, 24H Urine: 1430 — ABNORMAL HIGH
Urine Total Volume-UPROT: 275

## 2010-12-24 LAB — DIFFERENTIAL
Basophils Absolute: 0
Eosinophils Relative: 3
Lymphocytes Relative: 13
Neutro Abs: 7.8 — ABNORMAL HIGH
Neutrophils Relative %: 77

## 2010-12-24 LAB — CREATININE CLEARANCE, URINE, 24 HOUR
Collection Interval-CRCL: 24
Creatinine, Urine: 267
Creatinine: 0.84
Urine Total Volume-CRCL: 275

## 2010-12-24 LAB — URIC ACID
Uric Acid, Serum: 6.3
Uric Acid, Serum: 6.9

## 2010-12-24 LAB — DIC (DISSEMINATED INTRAVASCULAR COAGULATION)PANEL
INR: 0.9
Platelets: 383
Prothrombin Time: 11.9
aPTT: 27

## 2010-12-24 LAB — B-NATRIURETIC PEPTIDE (CONVERTED LAB): Pro B Natriuretic peptide (BNP): 54.8

## 2010-12-24 LAB — RPR: RPR Ser Ql: NONREACTIVE

## 2010-12-25 LAB — COMPREHENSIVE METABOLIC PANEL
ALT: 27
ALT: 31
ALT: 32
ALT: 38 — ABNORMAL HIGH
ALT: 41 — ABNORMAL HIGH
AST: 28
AST: 32
AST: 41 — ABNORMAL HIGH
AST: 41 — ABNORMAL HIGH
AST: 54 — ABNORMAL HIGH
Albumin: 1.8 — ABNORMAL LOW
Albumin: 1.8 — ABNORMAL LOW
Albumin: 1.9 — ABNORMAL LOW
Albumin: 1.9 — ABNORMAL LOW
Albumin: 2 — ABNORMAL LOW
Albumin: 2 — ABNORMAL LOW
Alkaline Phosphatase: 40
Alkaline Phosphatase: 41
Alkaline Phosphatase: 45
BUN: 14
BUN: 16
BUN: 17
BUN: 21
CO2: 21
CO2: 22
CO2: 22
CO2: 23
CO2: 25
Calcium: 7.7 — ABNORMAL LOW
Calcium: 7.7 — ABNORMAL LOW
Calcium: 7.9 — ABNORMAL LOW
Calcium: 8 — ABNORMAL LOW
Calcium: 8.3 — ABNORMAL LOW
Chloride: 104
Chloride: 105
Chloride: 106
Chloride: 108
Creatinine, Ser: 0.55
Creatinine, Ser: 0.64
Creatinine, Ser: 0.65
Creatinine, Ser: 0.71
Creatinine, Ser: 0.8
GFR calc Af Amer: >60
GFR calc Af Amer: >60
GFR calc Af Amer: >60
GFR calc Af Amer: >60
GFR calc Af Amer: >60
GFR calc non Af Amer: >60
GFR calc non Af Amer: >60
GFR calc non Af Amer: >60
GFR calc non Af Amer: >60
Glucose, Bld: 100 — ABNORMAL HIGH
Glucose, Bld: 250 — ABNORMAL HIGH
Glucose, Bld: 61 — ABNORMAL LOW
Potassium: 3.8
Potassium: 4.1
Potassium: 4.3
Sodium: 131 — ABNORMAL LOW
Sodium: 131 — ABNORMAL LOW
Sodium: 132 — ABNORMAL LOW
Sodium: 138
Total Bilirubin: 0.4
Total Bilirubin: 0.5
Total Bilirubin: 0.5
Total Bilirubin: 0.6
Total Protein: 4.4 — ABNORMAL LOW
Total Protein: 4.6 — ABNORMAL LOW
Total Protein: 4.8 — ABNORMAL LOW
Total Protein: 4.9 — ABNORMAL LOW

## 2010-12-25 LAB — CBC
HCT: 29.1 — ABNORMAL LOW
HCT: 29.1 — ABNORMAL LOW
HCT: 29.1 — ABNORMAL LOW
HCT: 30.4 — ABNORMAL LOW
HCT: 30.8 — ABNORMAL LOW
Hemoglobin: 10.1 — ABNORMAL LOW
Hemoglobin: 10.3 — ABNORMAL LOW
Hemoglobin: 10.7 — ABNORMAL LOW
Hemoglobin: 12.2
MCHC: 34.8
MCHC: 34.8
MCHC: 35.1
MCHC: 35.4
MCHC: 35.4
MCV: 96.1
MCV: 96.3
MCV: 96.8
MCV: 97.3
MCV: 97.3
MCV: 97.6
Platelets: 324
Platelets: 344
Platelets: 345
Platelets: 361
Platelets: 384
RBC: 2.83 — ABNORMAL LOW
RBC: 3.03 — ABNORMAL LOW
RBC: 3.12 — ABNORMAL LOW
RBC: 3.61 — ABNORMAL LOW
RDW: 12.9
RDW: 13
RDW: 13
RDW: 13.2
WBC: 11.5 — ABNORMAL HIGH
WBC: 11.6 — ABNORMAL HIGH
WBC: 12.2 — ABNORMAL HIGH
WBC: 7.8
WBC: 8.4

## 2010-12-25 LAB — LAMOTRIGINE LEVEL: Lamotrigine Lvl: 2.3 ug/mL — ABNORMAL LOW (ref 4.0–18.0)

## 2010-12-25 LAB — LACTATE DEHYDROGENASE
LDH: 141
LDH: 170
LDH: 182

## 2010-12-25 LAB — POCT URINALYSIS DIP (DEVICE)
Glucose, UA: 500 — AB
Ketones, ur: 15 — AB
Nitrite: NEGATIVE
Operator id: 120861
Protein, ur: >=300 — AB
Specific Gravity, Urine: >=1.03
Urobilinogen, UA: 2 — ABNORMAL HIGH
pH: 6

## 2010-12-25 LAB — CALCIUM, URINE, 24 HOUR
Calcium, 24 hour urine: 18 — ABNORMAL LOW
Calcium, Ur: 2
Urine Total Volume-UCA24: 900

## 2010-12-25 LAB — PROTEIN, URINE, 24 HOUR
Collection Interval-UPROT: 24
Urine Total Volume-UPROT: 900

## 2010-12-25 LAB — URIC ACID
Uric Acid, Serum: 4.5
Uric Acid, Serum: 4.8
Uric Acid, Serum: 5
Uric Acid, Serum: 5.7

## 2011-03-19 HISTORY — PX: COMBINED KIDNEY-PANCREAS TRANSPLANT: SHX1382

## 2011-03-25 ENCOUNTER — Ambulatory Visit: Payer: BC Managed Care – PPO | Admitting: Obstetrics & Gynecology

## 2011-04-02 ENCOUNTER — Ambulatory Visit: Payer: BC Managed Care – PPO | Admitting: Obstetrics and Gynecology

## 2011-04-08 DIAGNOSIS — N186 End stage renal disease: Secondary | ICD-10-CM

## 2011-04-08 HISTORY — DX: End stage renal disease: N18.6

## 2011-04-16 ENCOUNTER — Ambulatory Visit: Payer: BC Managed Care – PPO | Admitting: Obstetrics & Gynecology

## 2011-10-15 ENCOUNTER — Ambulatory Visit: Payer: BC Managed Care – PPO | Admitting: Obstetrics and Gynecology

## 2011-10-17 ENCOUNTER — Ambulatory Visit: Payer: BC Managed Care – PPO | Admitting: Obstetrics & Gynecology

## 2011-10-22 ENCOUNTER — Ambulatory Visit: Payer: BC Managed Care – PPO | Admitting: Obstetrics and Gynecology

## 2011-10-31 ENCOUNTER — Encounter (HOSPITAL_BASED_OUTPATIENT_CLINIC_OR_DEPARTMENT_OTHER): Payer: BC Managed Care – PPO

## 2011-11-12 ENCOUNTER — Encounter (HOSPITAL_BASED_OUTPATIENT_CLINIC_OR_DEPARTMENT_OTHER): Payer: BC Managed Care – PPO

## 2011-12-05 ENCOUNTER — Encounter (HOSPITAL_BASED_OUTPATIENT_CLINIC_OR_DEPARTMENT_OTHER): Payer: BC Managed Care – PPO

## 2012-01-21 ENCOUNTER — Other Ambulatory Visit (HOSPITAL_COMMUNITY)
Admission: RE | Admit: 2012-01-21 | Discharge: 2012-01-21 | Disposition: A | Discharge status: Home / Self Care | Payer: Medicare Other | Source: Ambulatory Visit | Attending: Obstetrics and Gynecology | Admitting: Obstetrics and Gynecology

## 2012-01-21 ENCOUNTER — Encounter: Payer: Self-pay | Admitting: Obstetrics and Gynecology

## 2012-01-21 ENCOUNTER — Ambulatory Visit (INDEPENDENT_AMBULATORY_CARE_PROVIDER_SITE_OTHER): Payer: Medicare Other | Admitting: Obstetrics and Gynecology

## 2012-01-21 VITALS — BP 137/82 | HR 88 | Ht 64.0 in | Wt 130.0 lb

## 2012-01-21 DIAGNOSIS — Z113 Encounter for screening for infections with a predominantly sexual mode of transmission: Secondary | ICD-10-CM | POA: Insufficient documentation

## 2012-01-21 DIAGNOSIS — Z1151 Encounter for screening for human papillomavirus (HPV): Secondary | ICD-10-CM | POA: Insufficient documentation

## 2012-01-21 DIAGNOSIS — Z01419 Encounter for gynecological examination (general) (routine) without abnormal findings: Secondary | ICD-10-CM

## 2012-01-21 DIAGNOSIS — Z124 Encounter for screening for malignant neoplasm of cervix: Secondary | ICD-10-CM | POA: Insufficient documentation

## 2012-01-21 NOTE — Progress Notes (Signed)
Patient is here for yearly exam and is having bleeding with intercourse.  She has a Mirena in and is due for removal in January 2014.

## 2012-01-21 NOTE — Patient Instructions (Signed)
Preventive Care for Adults, Female A healthy lifestyle and preventive care can promote health and wellness. Preventive health guidelines for women include the following key practices.  A routine yearly physical is a good way to check with your caregiver about your health and preventive screening. It is a chance to share any concerns and updates on your health, and to receive a thorough exam.  Visit your dentist for a routine exam and preventive care every 6 months. Brush your teeth twice a day and floss once a day. Good oral hygiene prevents tooth decay and gum disease.  The frequency of eye exams is based on your age, health, family medical history, use of contact lenses, and other factors. Follow your caregiver's recommendations for frequency of eye exams.  Eat a healthy diet. Foods like vegetables, fruits, whole grains, low-fat dairy products, and lean protein foods contain the nutrients you need without too many calories. Decrease your intake of foods high in solid fats, added sugars, and salt. Eat the right amount of calories for you.Get information about a proper diet from your caregiver, if necessary.  Regular physical exercise is one of the most important things you can do for your health. Most adults should get at least 150 minutes of moderate-intensity exercise (any activity that increases your heart rate and causes you to sweat) each week. In addition, most adults need muscle-strengthening exercises on 2 or more days a week.  Maintain a healthy weight. The body mass index (BMI) is a screening tool to identify possible weight problems. It provides an estimate of body fat based on height and weight. Your caregiver can help determine your BMI, and can help you achieve or maintain a healthy weight.For adults 20 years and older:  A BMI below 18.5 is considered underweight.  A BMI of 18.5 to 24.9 is normal.  A BMI of 25 to 29.9 is considered overweight.  A BMI of 30 and above is  considered obese.  Maintain normal blood lipids and cholesterol levels by exercising and minimizing your intake of saturated fat. Eat a balanced diet with plenty of fruit and vegetables. Blood tests for lipids and cholesterol should begin at age 20 and be repeated every 5 years. If your lipid or cholesterol levels are high, you are over 50, or you are at high risk for heart disease, you may need your cholesterol levels checked more frequently.Ongoing high lipid and cholesterol levels should be treated with medicines if diet and exercise are not effective.  If you smoke, find out from your caregiver how to quit. If you do not use tobacco, do not start.  If you are pregnant, do not drink alcohol. If you are breastfeeding, be very cautious about drinking alcohol. If you are not pregnant and choose to drink alcohol, do not exceed 1 drink per day. One drink is considered to be 12 ounces (355 mL) of beer, 5 ounces (148 mL) of wine, or 1.5 ounces (44 mL) of liquor.  Avoid use of street drugs. Do not share needles with anyone. Ask for help if you need support or instructions about stopping the use of drugs.  High blood pressure causes heart disease and increases the risk of stroke. Your blood pressure should be checked at least every 1 to 2 years. Ongoing high blood pressure should be treated with medicines if weight loss and exercise are not effective.  If you are 55 to 36 years old, ask your caregiver if you should take aspirin to prevent strokes.  Diabetes   screening involves taking a blood sample to check your fasting blood sugar level. This should be done once every 3 years, after age 45, if you are within normal weight and without risk factors for diabetes. Testing should be considered at a younger age or be carried out more frequently if you are overweight and have at least 1 risk factor for diabetes.  Breast cancer screening is essential preventive care for women. You should practice "breast  self-awareness." This means understanding the normal appearance and feel of your breasts and may include breast self-examination. Any changes detected, no matter how small, should be reported to a caregiver. Women in their 20s and 30s should have a clinical breast exam (CBE) by a caregiver as part of a regular health exam every 1 to 3 years. After age 40, women should have a CBE every year. Starting at age 40, women should consider having a mammography (breast X-ray test) every year. Women who have a family history of breast cancer should talk to their caregiver about genetic screening. Women at a high risk of breast cancer should talk to their caregivers about having magnetic resonance imaging (MRI) and a mammography every year.  The Pap test is a screening test for cervical cancer. A Pap test can show cell changes on the cervix that might become cervical cancer if left untreated. A Pap test is a procedure in which cells are obtained and examined from the lower end of the uterus (cervix).  Women should have a Pap test starting at age 21.  Between ages 21 and 29, Pap tests should be repeated every 2 years.  Beginning at age 30, you should have a Pap test every 3 years as long as the past 3 Pap tests have been normal.  Some women have medical problems that increase the chance of getting cervical cancer. Talk to your caregiver about these problems. It is especially important to talk to your caregiver if a new problem develops soon after your last Pap test. In these cases, your caregiver may recommend more frequent screening and Pap tests.  The above recommendations are the same for women who have or have not gotten the vaccine for human papillomavirus (HPV).  If you had a hysterectomy for a problem that was not cancer or a condition that could lead to cancer, then you no longer need Pap tests. Even if you no longer need a Pap test, a regular exam is a good idea to make sure no other problems are  starting.  If you are between ages 65 and 70, and you have had normal Pap tests going back 10 years, you no longer need Pap tests. Even if you no longer need a Pap test, a regular exam is a good idea to make sure no other problems are starting.  If you have had past treatment for cervical cancer or a condition that could lead to cancer, you need Pap tests and screening for cancer for at least 20 years after your treatment.  If Pap tests have been discontinued, risk factors (such as a new sexual partner) need to be reassessed to determine if screening should be resumed.  The HPV test is an additional test that may be used for cervical cancer screening. The HPV test looks for the virus that can cause the cell changes on the cervix. The cells collected during the Pap test can be tested for HPV. The HPV test could be used to screen women aged 30 years and older, and should   be used in women of any age who have unclear Pap test results. After the age of 30, women should have HPV testing at the same frequency as a Pap test.  Colorectal cancer can be detected and often prevented. Most routine colorectal cancer screening begins at the age of 50 and continues through age 75. However, your caregiver may recommend screening at an earlier age if you have risk factors for colon cancer. On a yearly basis, your caregiver may provide home test kits to check for hidden blood in the stool. Use of a small camera at the end of a tube, to directly examine the colon (sigmoidoscopy or colonoscopy), can detect the earliest forms of colorectal cancer. Talk to your caregiver about this at age 50, when routine screening begins. Direct examination of the colon should be repeated every 5 to 10 years through age 75, unless early forms of pre-cancerous polyps or small growths are found.  Hepatitis C blood testing is recommended for all people born from 1945 through 1965 and any individual with known risks for hepatitis C.  Practice  safe sex. Use condoms and avoid high-risk sexual practices to reduce the spread of sexually transmitted infections (STIs). STIs include gonorrhea, chlamydia, syphilis, trichomonas, herpes, HPV, and human immunodeficiency virus (HIV). Herpes, HIV, and HPV are viral illnesses that have no cure. They can result in disability, cancer, and death. Sexually active women aged 25 and younger should be checked for chlamydia. Older women with new or multiple partners should also be tested for chlamydia. Testing for other STIs is recommended if you are sexually active and at increased risk.  Osteoporosis is a disease in which the bones lose minerals and strength with aging. This can result in serious bone fractures. The risk of osteoporosis can be identified using a bone density scan. Women ages 65 and over and women at risk for fractures or osteoporosis should discuss screening with their caregivers. Ask your caregiver whether you should take a calcium supplement or vitamin D to reduce the rate of osteoporosis.  Menopause can be associated with physical symptoms and risks. Hormone replacement therapy is available to decrease symptoms and risks. You should talk to your caregiver about whether hormone replacement therapy is right for you.  Use sunscreen with sun protection factor (SPF) of 30 or more. Apply sunscreen liberally and repeatedly throughout the day. You should seek shade when your shadow is shorter than you. Protect yourself by wearing long sleeves, pants, a wide-brimmed hat, and sunglasses year round, whenever you are outdoors.  Once a month, do a whole body skin exam, using a mirror to look at the skin on your back. Notify your caregiver of new moles, moles that have irregular borders, moles that are larger than a pencil eraser, or moles that have changed in shape or color.  Stay current with required immunizations.  Influenza. You need a dose every fall (or winter). The composition of the flu vaccine  changes each year, so being vaccinated once is not enough.  Pneumococcal polysaccharide. You need 1 to 2 doses if you smoke cigarettes or if you have certain chronic medical conditions. You need 1 dose at age 65 (or older) if you have never been vaccinated.  Tetanus, diphtheria, pertussis (Tdap, Td). Get 1 dose of Tdap vaccine if you are younger than age 65, are over 65 and have contact with an infant, are a healthcare worker, are pregnant, or simply want to be protected from whooping cough. After that, you need a Td   booster dose every 10 years. Consult your caregiver if you have not had at least 3 tetanus and diphtheria-containing shots sometime in your life or have a deep or dirty wound.  HPV. You need this vaccine if you are a woman age 26 or younger. The vaccine is given in 3 doses over 6 months.  Measles, mumps, rubella (MMR). You need at least 1 dose of MMR if you were born in 1957 or later. You may also need a second dose.  Meningococcal. If you are age 19 to 21 and a first-year college student living in a residence hall, or have one of several medical conditions, you need to get vaccinated against meningococcal disease. You may also need additional booster doses.  Zoster (shingles). If you are age 60 or older, you should get this vaccine.  Varicella (chickenpox). If you have never had chickenpox or you were vaccinated but received only 1 dose, talk to your caregiver to find out if you need this vaccine.  Hepatitis A. You need this vaccine if you have a specific risk factor for hepatitis A virus infection or you simply wish to be protected from this disease. The vaccine is usually given as 2 doses, 6 to 18 months apart.  Hepatitis B. You need this vaccine if you have a specific risk factor for hepatitis B virus infection or you simply wish to be protected from this disease. The vaccine is given in 3 doses, usually over 6 months. Preventive Services / Frequency Ages 19 to 39  Blood  pressure check.** / Every 1 to 2 years.  Lipid and cholesterol check.** / Every 5 years beginning at age 20.  Clinical breast exam.** / Every 3 years for women in their 20s and 30s.  Pap test.** / Every 2 years from ages 21 through 29. Every 3 years starting at age 30 through age 65 or 70 with a history of 3 consecutive normal Pap tests.  HPV screening.** / Every 3 years from ages 30 through ages 65 to 70 with a history of 3 consecutive normal Pap tests.  Hepatitis C blood test.** / For any individual with known risks for hepatitis C.  Skin self-exam. / Monthly.  Influenza immunization.** / Every year.  Pneumococcal polysaccharide immunization.** / 1 to 2 doses if you smoke cigarettes or if you have certain chronic medical conditions.  Tetanus, diphtheria, pertussis (Tdap, Td) immunization. / A one-time dose of Tdap vaccine. After that, you need a Td booster dose every 10 years.  HPV immunization. / 3 doses over 6 months, if you are 26 and younger.  Measles, mumps, rubella (MMR) immunization. / You need at least 1 dose of MMR if you were born in 1957 or later. You may also need a second dose.  Meningococcal immunization. / 1 dose if you are age 19 to 21 and a first-year college student living in a residence hall, or have one of several medical conditions, you need to get vaccinated against meningococcal disease. You may also need additional booster doses.  Varicella immunization.** / Consult your caregiver.  Hepatitis A immunization.** / Consult your caregiver. 2 doses, 6 to 18 months apart.  Hepatitis B immunization.** / Consult your caregiver. 3 doses usually over 6 months. ** Family history and personal history of risk and conditions may change your caregiver's recommendations. Document Released: 04/30/2001 Document Revised: 05/27/2011 Document Reviewed: 07/30/2010 [employer] Patient Information 2013 [employer].  

## 2012-01-21 NOTE — Progress Notes (Signed)
  Subjective:     [name] is a 36 y.o. female with LMP 01/21/2012 and BMI 22 who is here for a comprehensive physical exam. The patient reports no problem other than postcoital vaginal bleeding. Patient has been sexually active in the past month and has noticed light vaginal bleeding after intercourse which decreases to spotting by the end of day. Patient is using Mirena IUD for birth control which is due to be replaced in January. Patient denies any vaginal bleeding during the time she had the Mirena IUD until recently.   History   Social History  . Marital Status: Single    Spouse Name: N/A    Number of Children: N/A  . Years of Education: N/A   Occupational History  . Not on file.   Social History Main Topics  . Smoking status: Never Smoker   . Smokeless tobacco: Not on file  . Alcohol Use: No  . Drug Use: No  . Sexually Active: Yes -- Female partner(s)    Birth Control/ Protection: IUD   Other Topics Concern  . Not on file   Social History Narrative  . No narrative on file   Health Maintenance  Topic Date Due  . Pap Smear  05/28/1993  . Tetanus/tdap  05/29/1994  . Influenza Vaccine  11/17/2011   Past Surgical History  Procedure Date  . Combined kidney-pancreas transplant 2013  . Left fem 1999    rod in left femur  . Ulna surgery 1999    plate put in arm  . Femur surgery 2001    rod removed  . Wrist surgery 2006    right wrist broken  . Elbow surgery 2009  . Catheter surgery for dialysis 2011  . Removal of a dialysis catheter 2012    removed due to infection/tn  . Catheter surgery for dialysis 2012   Past Medical History  Diagnosis Date  . Allergy   . Anxiety   . Cataract   . Diabetes mellitus without complication     history of/had transplant/tn  . Heart murmur   . Chronic kidney disease 04/08/2011    Kidney transplant  . Seizures        Review of Systems A comprehensive review of systems was negative.   Objective:       GENERAL:  Well-developed, well-nourished female in no acute distress.  HEENT: Normocephalic, atraumatic. Sclerae anicteric.  NECK: Supple. Normal thyroid.  LUNGS: Clear to auscultation bilaterally.  HEART: Regular rate and rhythm. BREASTS: Symmetric in size. No palpable masses or lymphadenopathy, skin changes, or nipple drainage. ABDOMEN: Soft, nontender, nondistended. No organomegaly. Palpable mass in RLQ  PELVIC: Normal external female genitalia. Vagina is pink and rugated.  Normal discharge. Normal appearing cervix but friable to touch- bled easily with cytobrush. Uterus is normal in size. No adnexal mass or tenderness. Palpable mass in RLQ which represents transplanted kidney. EXTREMITIES: No cyanosis, clubbing, or edema, 2+ distal pulses.  Assessment:    Healthy female exam.      Plan:    Pap smear with cultures performed Patient advised to continue monthly self breast and vulva exams RTC in January for IUD replacement Advised to monitor vaginal bleeding which may be due to a friable cervix or IUD which id due for replacement in a few months. See After Visit Summary for Counseling Recommendations

## 2012-03-03 ENCOUNTER — Encounter: Payer: Self-pay | Admitting: Family Medicine

## 2012-03-03 ENCOUNTER — Ambulatory Visit (INDEPENDENT_AMBULATORY_CARE_PROVIDER_SITE_OTHER): Payer: Medicare Other | Admitting: Family Medicine

## 2012-03-03 VITALS — BP 96/61 | HR 99 | Ht 64.0 in | Wt 126.0 lb

## 2012-03-03 DIAGNOSIS — Z30430 Encounter for insertion of intrauterine contraceptive device: Secondary | ICD-10-CM | POA: Insufficient documentation

## 2012-03-03 DIAGNOSIS — Z30433 Encounter for removal and reinsertion of intrauterine contraceptive device: Secondary | ICD-10-CM

## 2012-03-03 MED ORDER — LEVONORGESTREL 20 MCG/24HR IU IUD: 1.0000 | INTRAUTERINE_SYSTEM | Freq: Once | INTRAUTERINE | Status: AC

## 2012-03-03 NOTE — Patient Instructions (Signed)

## 2012-03-03 NOTE — Progress Notes (Signed)
Patient ID: [name], female   DOB: October 19, 1975, 36 y.o.   MRN: [id number] Procedure: Speculum placed inside vagina.  Cervix visualized.  Strings grasped with ring forceps.  IUD removed intact.   Patient identified, informed consent performed, signed copy in chart, time out was performed.    Speculum placed in the vagina.  Cervix visualized.  Cleaned with Betadine x 2.  Grasped anteriourly with a single tooth tenaculum.  Uterus sounded to 7 cm.  Mirena IUD placed per manufacturer's recommendations.  Strings trimmed to 3 cm.   Patient given post procedure instructions and Mirena care card with expiration date.  Patient is asked to check IUD strings periodically and follow up in 4-6 weeks for IUD check.

## 2012-03-31 ENCOUNTER — Ambulatory Visit (INDEPENDENT_AMBULATORY_CARE_PROVIDER_SITE_OTHER): Payer: Medicare Other | Admitting: Obstetrics & Gynecology

## 2012-03-31 ENCOUNTER — Encounter: Payer: Self-pay | Admitting: Obstetrics & Gynecology

## 2012-03-31 VITALS — BP 117/74 | HR 88 | Ht 64.0 in | Wt 128.0 lb

## 2012-03-31 DIAGNOSIS — Z30431 Encounter for routine checking of intrauterine contraceptive device: Secondary | ICD-10-CM

## 2012-03-31 NOTE — Progress Notes (Signed)
  Subjective:    Patient ID: [name], female    DOB: 08-29-1975, 37 y.o.   MRN: [id number]  HPI  She is here for a follow up after having Mirena inserted about a month ago. She does report some irregular bleeding. She would be interested in taking OCPs on a prn basis. She has already had a flu vaccine this season. She denies other complaints.  Review of Systems     Objective:   Physical Exam  Normal appearing cervix and discharge IUD string visualized      Assessment & Plan:  As above

## 2012-08-14 ENCOUNTER — Encounter: Payer: Self-pay | Admitting: Obstetrics & Gynecology

## 2012-08-14 ENCOUNTER — Ambulatory Visit (INDEPENDENT_AMBULATORY_CARE_PROVIDER_SITE_OTHER): Payer: Medicare Other | Admitting: Obstetrics & Gynecology

## 2012-08-14 VITALS — BP 131/87 | HR 89 | Resp 16 | Ht 64.0 in | Wt 133.0 lb

## 2012-08-14 DIAGNOSIS — R5381 Other malaise: Secondary | ICD-10-CM

## 2012-08-14 DIAGNOSIS — R5383 Other fatigue: Secondary | ICD-10-CM

## 2012-08-14 DIAGNOSIS — N9489 Other specified conditions associated with female genital organs and menstrual cycle: Secondary | ICD-10-CM

## 2012-08-14 DIAGNOSIS — N938 Other specified abnormal uterine and vaginal bleeding: Secondary | ICD-10-CM

## 2012-08-14 DIAGNOSIS — N949 Unspecified condition associated with female genital organs and menstrual cycle: Secondary | ICD-10-CM

## 2012-08-14 MED ORDER — LEVONORGESTREL-ETHINYL ESTRAD 0.1-20 MG-MCG PO TABS: 1.0000 | ORAL_TABLET | Freq: Every day | ORAL | Status: DC

## 2012-08-14 NOTE — Progress Notes (Signed)
  Subjective:    Patient ID: [name], female    DOB: 1975/12/25, 37 y.o.   MRN: [id number]  HPI  [name] is here today because she had an occasion of heavy vaginal bleeding about 2 days ago. It has resolved now. She has some fatigue.   Review of Systems     Objective:   Physical Exam  IUD string seen. A small amount of bloody mucous noted. No CMT     Assessment & Plan:  DUB- probably related to the Mirena. Reassurance given. Check CBC and TSH. She can take a Levlite pill prn bleeding. She denies smoking.

## 2012-08-14 NOTE — Addendum Note (Signed)
Addended by: Barbara Cower on: 08/14/2012 11:45 AM   Modules accepted: Orders

## 2012-08-15 LAB — CBC
HCT: 44.6 % (ref 36.0–46.0)
Hemoglobin: 14.7 g/dL (ref 12.0–15.0)
WBC: 4.9 10*3/uL (ref 4.0–10.5)

## 2012-08-16 LAB — GC/CHLAMYDIA PROBE AMP
CT Probe RNA: NEGATIVE
GC Probe RNA: NEGATIVE

## 2012-08-24 ENCOUNTER — Telehealth: Payer: Self-pay | Admitting: *Deleted

## 2012-08-24 DIAGNOSIS — N938 Other specified abnormal uterine and vaginal bleeding: Secondary | ICD-10-CM

## 2012-08-24 NOTE — Telephone Encounter (Signed)
Patient is still having bleeding and she has been taking her medication as directed.  We will send her for an ultrasound to further evaluate her bleeding.

## 2012-08-27 ENCOUNTER — Ambulatory Visit (HOSPITAL_COMMUNITY): Payer: Medicare Other

## 2012-09-03 ENCOUNTER — Other Ambulatory Visit: Payer: Self-pay | Admitting: Family Medicine

## 2012-09-03 ENCOUNTER — Ambulatory Visit (HOSPITAL_COMMUNITY)
Admission: RE | Admit: 2012-09-03 | Discharge: 2012-09-03 | Disposition: A | Discharge status: Home / Self Care | Payer: Medicare Other | Source: Ambulatory Visit | Attending: Family Medicine | Admitting: Family Medicine

## 2012-09-03 DIAGNOSIS — N938 Other specified abnormal uterine and vaginal bleeding: Secondary | ICD-10-CM

## 2012-09-03 DIAGNOSIS — Z940 Kidney transplant status: Secondary | ICD-10-CM | POA: Insufficient documentation

## 2012-09-03 DIAGNOSIS — N898 Other specified noninflammatory disorders of vagina: Secondary | ICD-10-CM | POA: Insufficient documentation

## 2012-09-03 DIAGNOSIS — Z975 Presence of (intrauterine) contraceptive device: Secondary | ICD-10-CM | POA: Insufficient documentation

## 2012-10-23 ENCOUNTER — Ambulatory Visit: Payer: Medicare Other | Admitting: Obstetrics & Gynecology

## 2012-10-29 ENCOUNTER — Ambulatory Visit: Payer: Medicare Other | Admitting: Obstetrics & Gynecology

## 2012-11-11 ENCOUNTER — Ambulatory Visit: Payer: Medicare Other | Admitting: Obstetrics & Gynecology

## 2012-11-11 DIAGNOSIS — Z113 Encounter for screening for infections with a predominantly sexual mode of transmission: Secondary | ICD-10-CM

## 2012-11-19 ENCOUNTER — Encounter: Payer: Self-pay | Admitting: Obstetrics & Gynecology

## 2012-11-19 ENCOUNTER — Ambulatory Visit (INDEPENDENT_AMBULATORY_CARE_PROVIDER_SITE_OTHER): Payer: Medicare Other | Admitting: Obstetrics & Gynecology

## 2012-11-19 VITALS — BP 128/85 | HR 84 | Ht 64.0 in | Wt 133.0 lb

## 2012-11-19 DIAGNOSIS — N898 Other specified noninflammatory disorders of vagina: Secondary | ICD-10-CM

## 2012-11-19 DIAGNOSIS — N760 Acute vaginitis: Secondary | ICD-10-CM

## 2012-11-19 NOTE — Progress Notes (Signed)
GYNECOLOGY CLINIC ENCOUNTER NOTE  History:  37 y.o. G4P1 here today for evaluation of abnormal vaginal discharge for a few weeks.  Describes as yellow and malodorous, associated with itching.  No other concerns.  The following portions of the patient's history were reviewed and updated as appropriate: allergies, current medications, past family history, past medical history, past social history, past surgical history and problem list. Negative pap smear with negative HRHPV on 01/21/12.  Review of Systems:  Pertinent items are noted in HPI.  Objective:  Physical Exam BP 128/85  Pulse 84  Ht 5\' 4"  (1.626 m)  Wt 133 lb (60.328 kg)  BMI 22.82 kg/m2 Gen: NAD Abd: Soft, nontender and nondistended Pelvic: Normal appearing external genitalia; normal appearing vaginal mucosa and cervix. Mirena IUD strings visualized.  Copious amount of yellow, malodorous vaginal discharge noted, samples obtained for wet prep and GC/Chlam testing.  Small uterus, no other palpable masses, no uterine or adnexal tenderness  Assessment & Plan:  Will follow up results and manage accordingly. Proper vulvar hygiene emphasized: discussed avoidance of perfumed soaps, detergents, lotions and any type of douches; in addition to wearing cotton underwear and no underwear at night.   Routine preventative health maintenance measures emphasized.

## 2012-11-19 NOTE — Patient Instructions (Signed)
Return to clinic for any scheduled appointments or for any gynecologic concerns as needed.   

## 2012-11-20 ENCOUNTER — Telehealth: Payer: Self-pay | Admitting: *Deleted

## 2012-11-20 DIAGNOSIS — A599 Trichomoniasis, unspecified: Secondary | ICD-10-CM

## 2012-11-20 LAB — WET PREP, GENITAL
Clue Cells Wet Prep HPF POC: NONE SEEN
Yeast Wet Prep HPF POC: NONE SEEN

## 2012-11-20 LAB — GC/CHLAMYDIA PROBE AMP: GC Probe RNA: NEGATIVE

## 2012-11-20 MED ORDER — METRONIDAZOLE 500 MG PO TABS
ORAL_TABLET | ORAL | Status: DC
Start: 2012-11-20 — End: 2012-12-03

## 2012-11-20 NOTE — Telephone Encounter (Signed)
Patient called and given positive test results for Trich.  Meds will be called in for patient and she will notify her partner and let him know he needs to be treated as well.

## 2012-11-22 IMAGING — CR DG FOOT COMPLETE 3+V*L*
3 series · 3 of 3 positions shown · non-contrast
Comparison: None.

CLINICAL DATA: Left great toe wound.  Osteomyelitis.  Pre
hyperbaric treatment.

LEFT FOOT - COMPLETE 3+ VIEW

[t foot ap left]
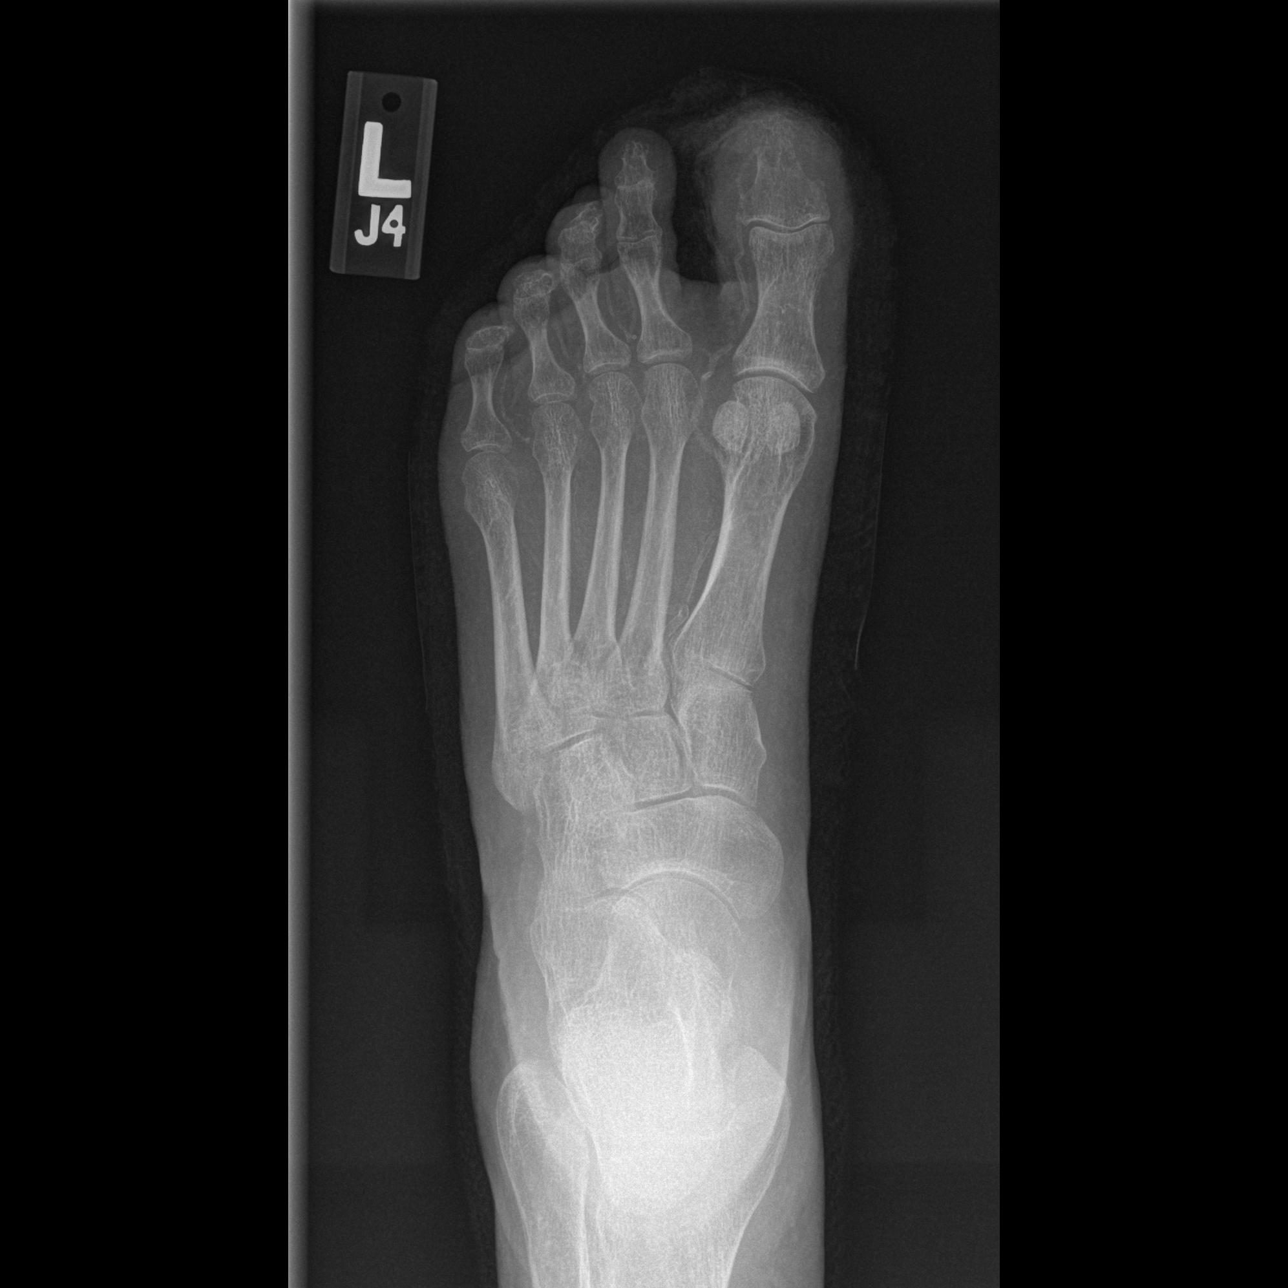

[t foot oblique left]
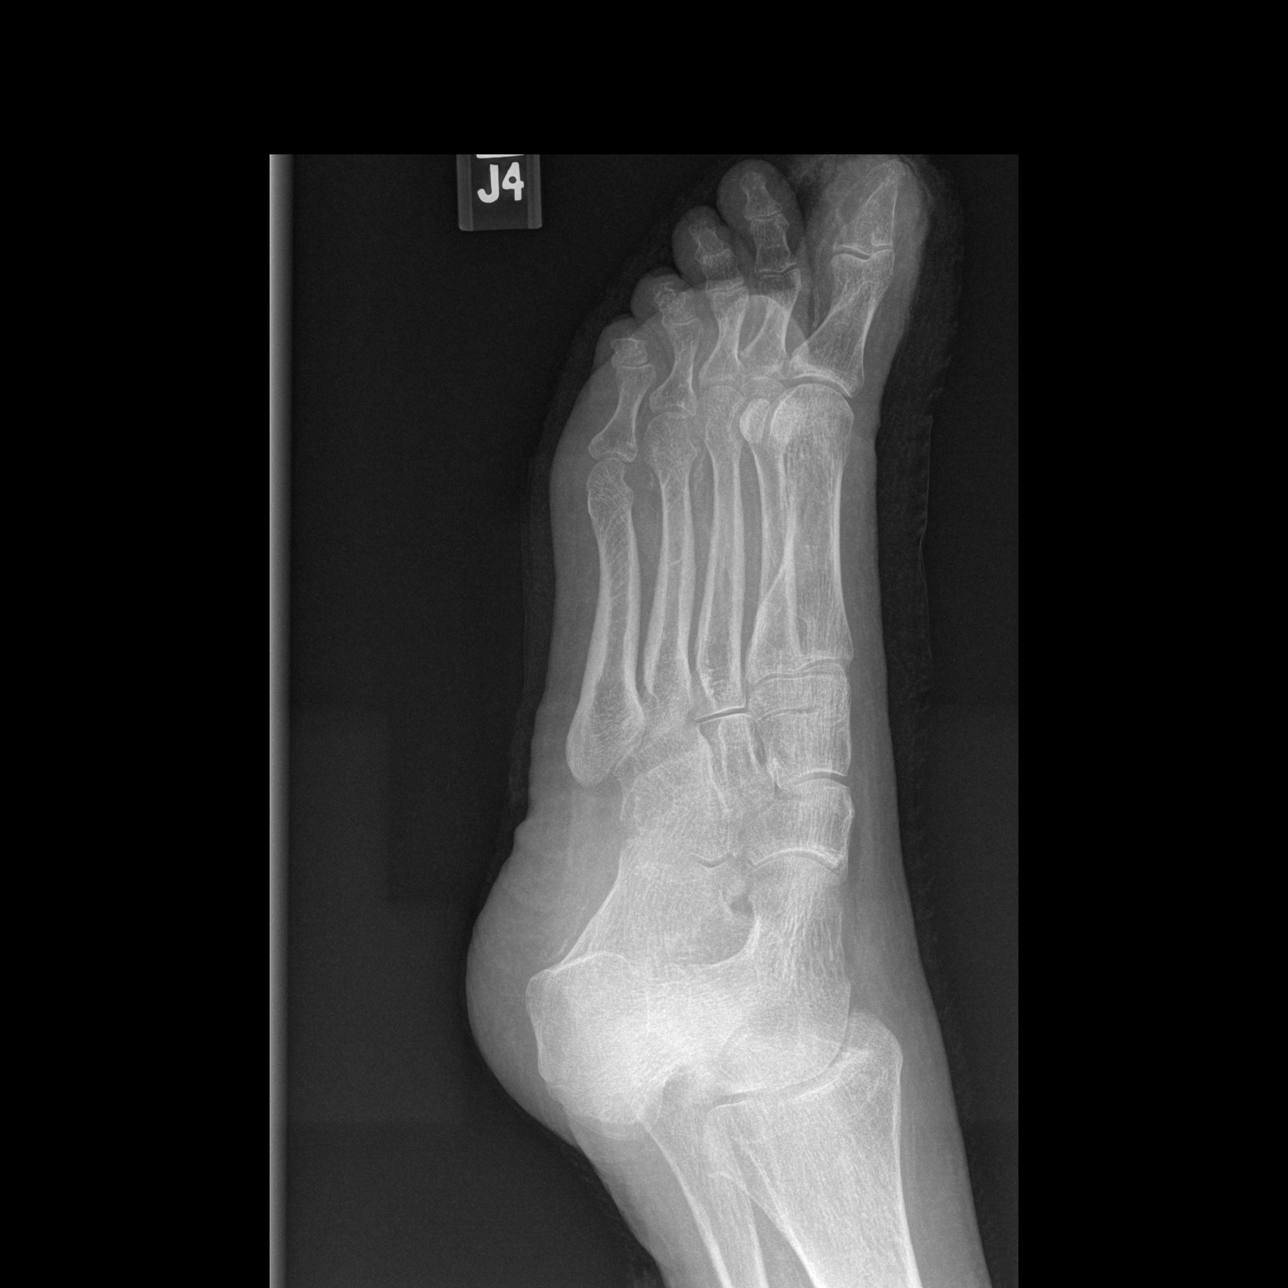

[t foot lat left]
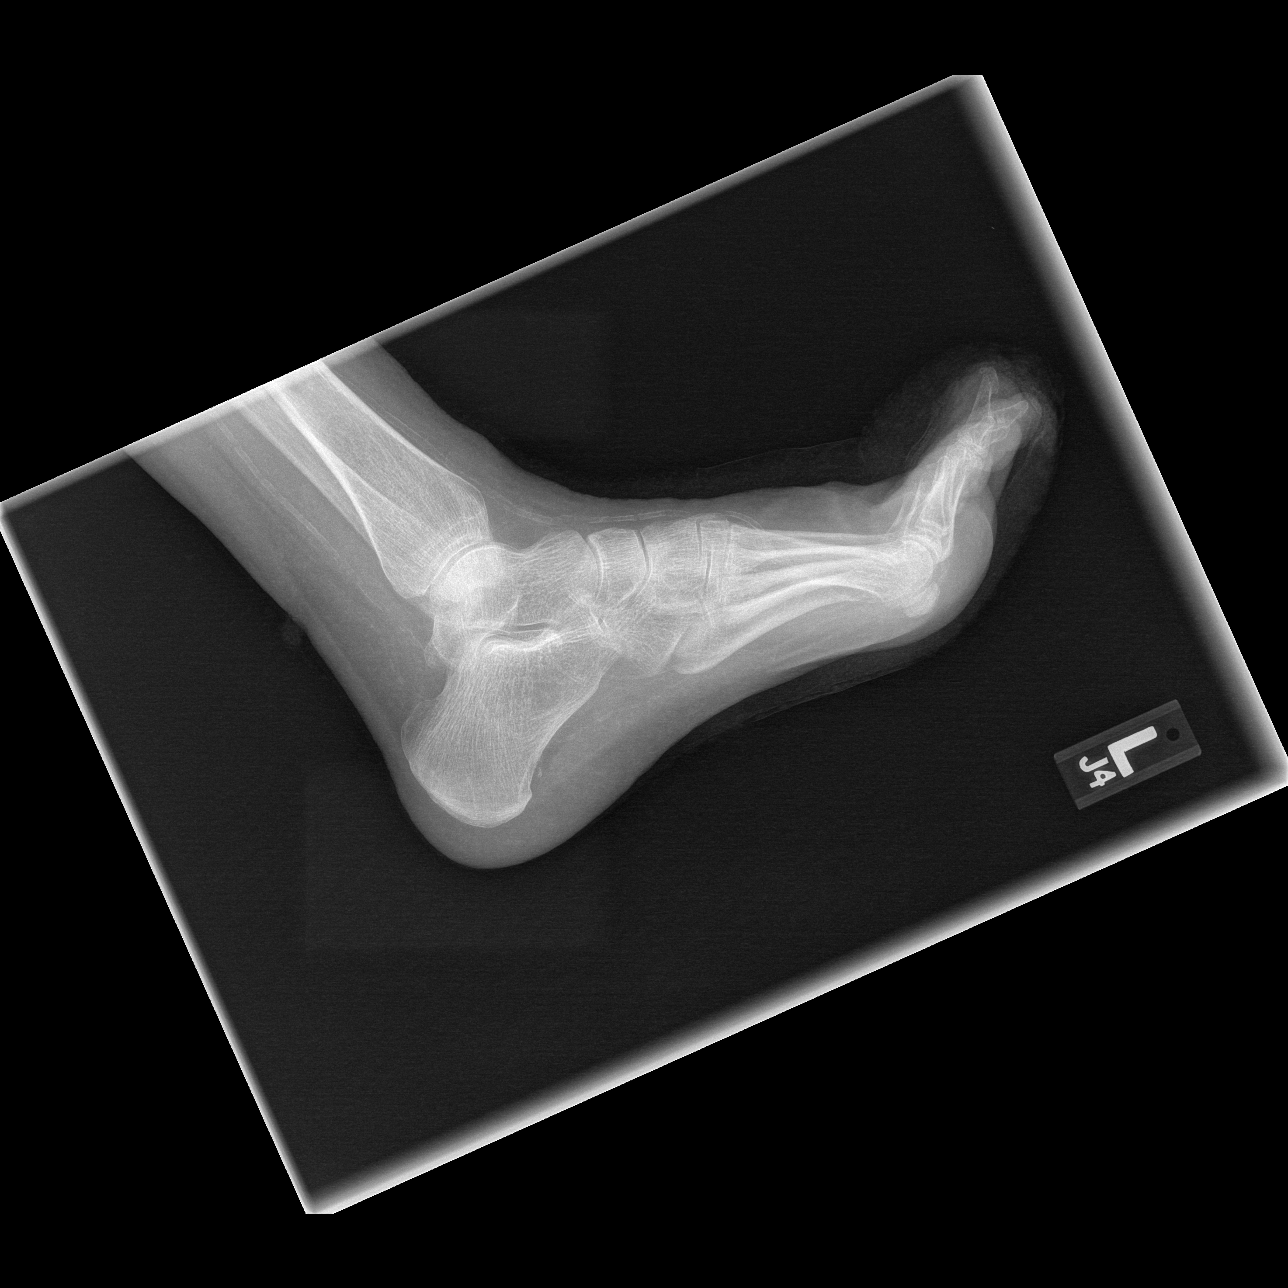

[3 of 3 positions shown; findings below may reference images not displayed]

FINDINGS: There is generalized osteopenia.  There is soft tissue
swelling and irregularity of the distal great toe.  The cortex of
the distal tuft of the great toe is poorly defined on the AP and
oblique views. On the lateral view, the cortex is better defined,
but appears somewhat irregular.  No other bone destruction is
identified.  There are diffuse vascular calcifications.
IMPRESSION: Soft tissue irregularity of the great toe with poor definition of
the cortex of the distal tuft.  This could represent distal
phalangeal osteomyelitis.

## 2012-11-22 IMAGING — CR DG CHEST 2V
2 series · 2 of 2 positions shown · non-contrast
Comparison: 02/26/2007

CLINICAL DATA: Osteomyelitis.  Pre hyperbaric oxygen evaluation.
Hypertension and diabetes.

CHEST - 2 VIEW

[w chest pa]
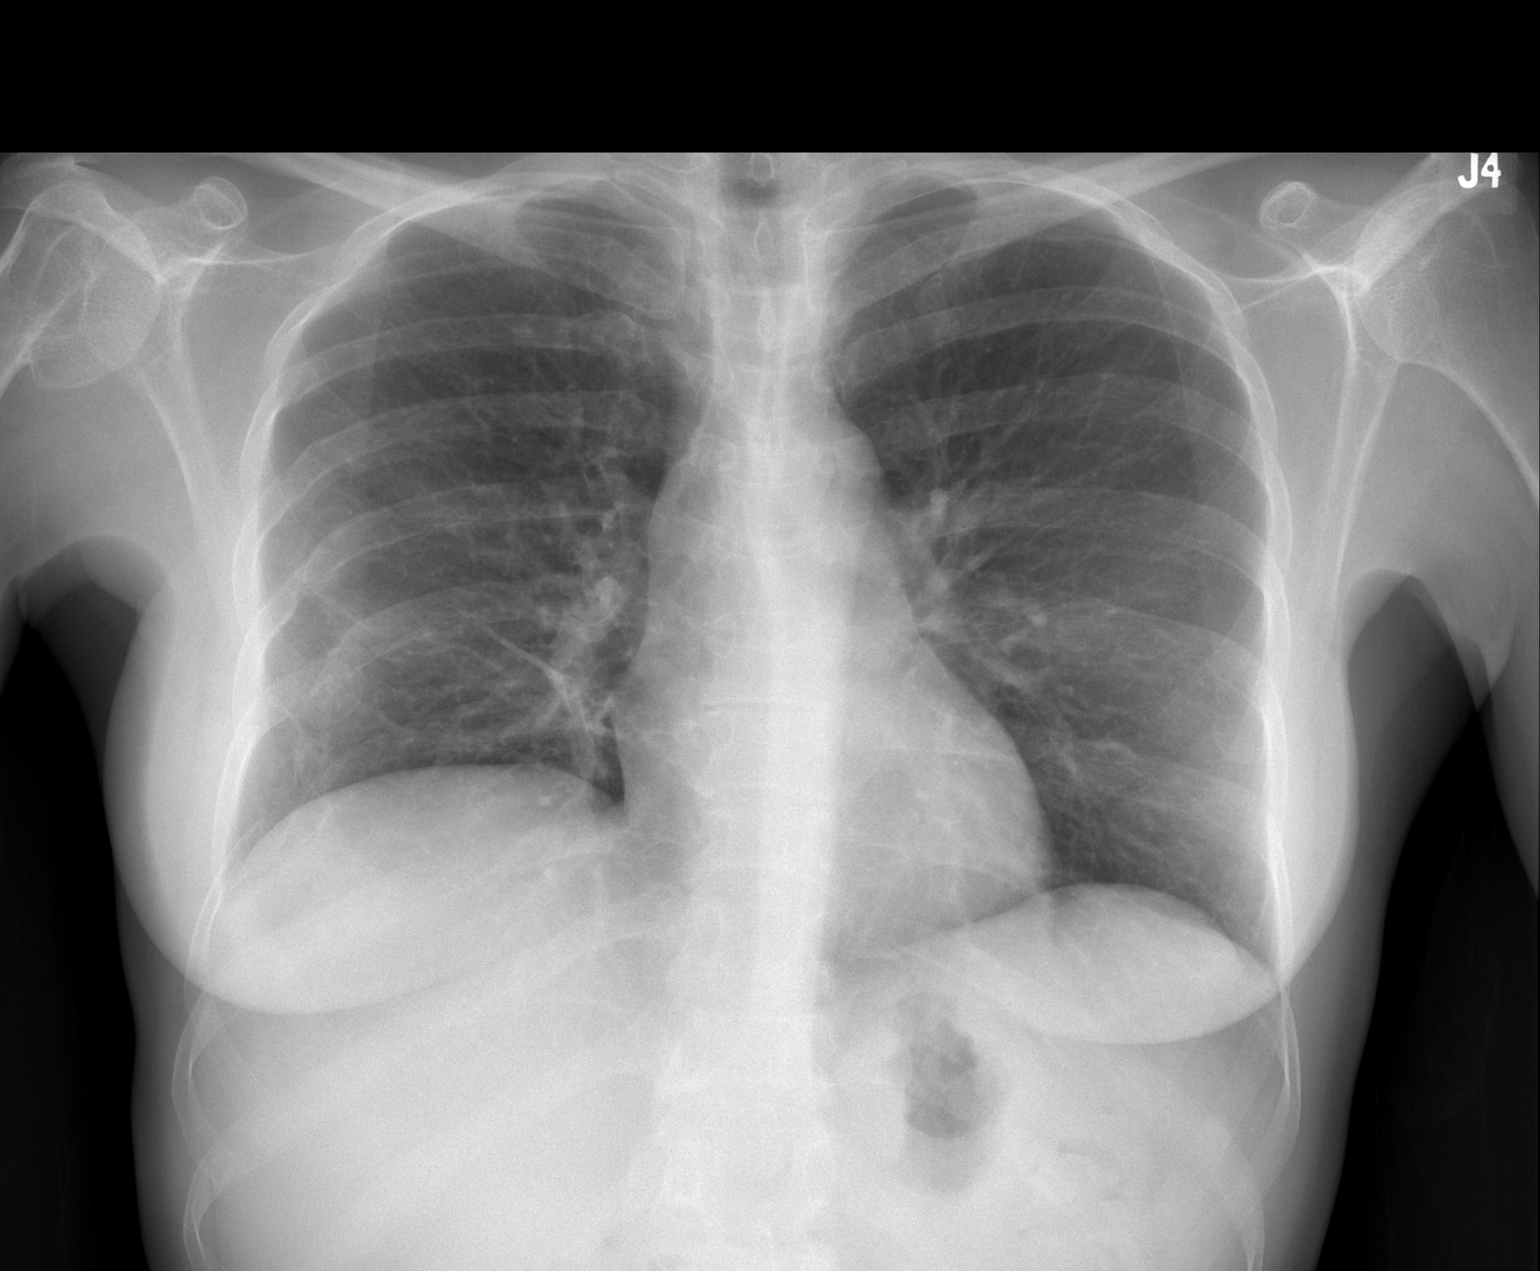

[w chest lat]
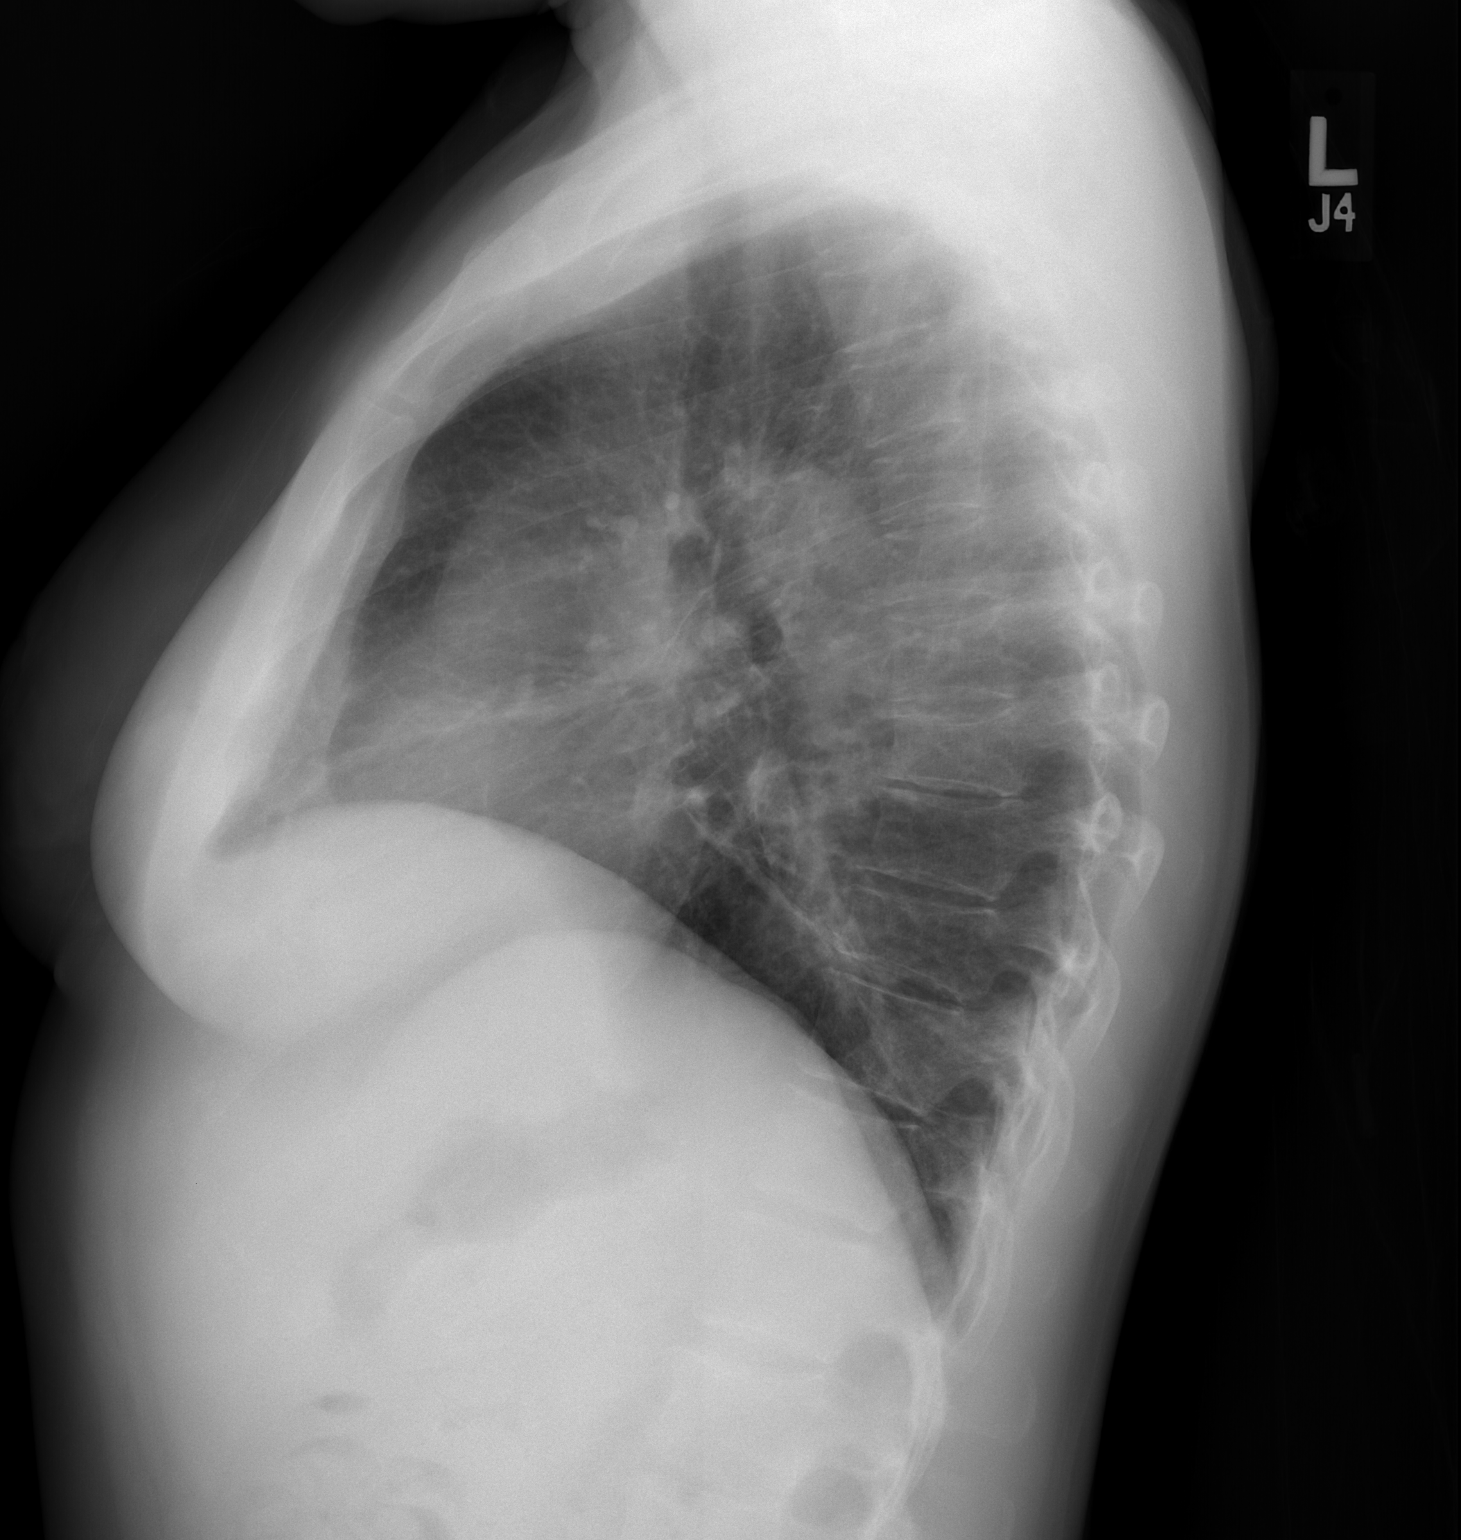

[2 of 2 positions shown; findings below may reference images not displayed]

FINDINGS: Heart size is normal.  Mediastinal shadows are normal.
There is chronic pulmonary scarring in the right lower lung.  There
are old rib deformities on the right.  No acute bony finding.
IMPRESSION: Chronic scarring in the right lower lung.  No active disease
identified.

## 2012-11-28 ENCOUNTER — Emergency Department: Payer: Self-pay | Admitting: Emergency Medicine

## 2012-12-03 ENCOUNTER — Telehealth: Payer: Self-pay | Admitting: *Deleted

## 2012-12-03 DIAGNOSIS — A599 Trichomoniasis, unspecified: Secondary | ICD-10-CM

## 2012-12-03 MED ORDER — METRONIDAZOLE 500 MG PO TABS: ORAL_TABLET | ORAL | Status: DC

## 2012-12-03 NOTE — Telephone Encounter (Signed)
Patient is still having discharge and would like to repeat the course of flagyl to be sure it is all gone.  It is better but not completely gone.

## 2013-01-12 ENCOUNTER — Ambulatory Visit: Payer: Medicare Other | Admitting: Family Medicine

## 2013-01-15 ENCOUNTER — Ambulatory Visit: Payer: Medicare Other | Admitting: Obstetrics and Gynecology

## 2013-01-21 ENCOUNTER — Other Ambulatory Visit: Payer: Self-pay

## 2013-01-28 ENCOUNTER — Ambulatory Visit: Payer: Medicare Other | Admitting: Obstetrics & Gynecology

## 2013-02-02 ENCOUNTER — Ambulatory Visit: Payer: Medicare Other | Admitting: Obstetrics & Gynecology

## 2013-02-02 DIAGNOSIS — Z01419 Encounter for gynecological examination (general) (routine) without abnormal findings: Secondary | ICD-10-CM

## 2013-12-31 ENCOUNTER — Other Ambulatory Visit: Payer: Self-pay

## 2014-01-17 ENCOUNTER — Encounter: Payer: Self-pay | Admitting: Obstetrics & Gynecology

## 2014-01-19 ENCOUNTER — Telehealth: Payer: Self-pay | Admitting: *Deleted

## 2014-01-19 NOTE — Telephone Encounter (Signed)
Patient is having pelvic pain and feels a hard lump on the right side.  She is hurting all the time for about 3 days now.  Advised patient to come in to be seen.

## 2014-01-24 ENCOUNTER — Ambulatory Visit: Payer: Medicare Other | Admitting: Obstetrics & Gynecology

## 2014-02-01 ENCOUNTER — Ambulatory Visit: Payer: Medicare Other | Admitting: Family Medicine

## 2014-02-08 ENCOUNTER — Ambulatory Visit: Payer: Medicare Other | Admitting: Family Medicine

## 2014-02-09 ENCOUNTER — Encounter: Payer: Self-pay | Admitting: Physician Assistant

## 2014-02-09 ENCOUNTER — Ambulatory Visit (INDEPENDENT_AMBULATORY_CARE_PROVIDER_SITE_OTHER): Payer: Medicare Other | Admitting: Physician Assistant

## 2014-02-09 VITALS — BP 174/98 | HR 80 | Wt 132.0 lb

## 2014-02-09 DIAGNOSIS — N72 Inflammatory disease of cervix uteri: Secondary | ICD-10-CM

## 2014-02-09 DIAGNOSIS — R102 Pelvic and perineal pain: Secondary | ICD-10-CM | POA: Insufficient documentation

## 2014-02-09 DIAGNOSIS — Z113 Encounter for screening for infections with a predominantly sexual mode of transmission: Secondary | ICD-10-CM

## 2014-02-09 MED ORDER — CEFTRIAXONE SODIUM 1 G IJ SOLR
250.0000 mg | INTRAMUSCULAR | Status: DC
  Administered 2014-02-09: 250 mg via INTRAMUSCULAR

## 2014-02-09 MED ORDER — MINOCYCLINE HCL 100 MG PO CAPS: 100.0000 mg | ORAL_CAPSULE | Freq: Two times a day (BID) | ORAL | Status: DC

## 2014-02-09 MED ORDER — METRONIDAZOLE 500 MG PO TABS: 500.0000 mg | ORAL_TABLET | Freq: Two times a day (BID) | ORAL | Status: DC

## 2014-02-09 NOTE — Addendum Note (Signed)
Addended by: Barbara CowerNOGUES, Nyaisha Simao L on: 02/09/2014 03:18 PM   Modules accepted: Orders

## 2014-02-09 NOTE — Progress Notes (Signed)
Patient ID: Debra DoughtyJamie R Stein, female   DOB: 29-Dec-1975, 38 y.o.   MRN: [id number] History:  Debra CharonJamie Stein is a 38 y.o. G4P1 who presents to clinic today for right sided pelvic pain.  A few weeks ago she noticed pain and swelling in the right lower abdomen.  A week prior to that, she had a UTI and had some vaginal bleeding. The pain is rated 10/10 and it comes and goes.  Heat makes it better.   Bending or increasing pressure at the sight makes the pain worse.  Sex has recently started becoming uncomfortable right side only.   Mirena IUD has been in place two years without difficulty.  She has irregular bleeding at times that is light.  She denies fever, nausea, vomiting, vaginal bleeding at present, diarrhea, constipation.    The following portions of the patient's history were reviewed and updated as appropriate: allergies, current medications, past family history, past medical history, past social history, past surgical history and problem list.  Review of Systems:  Pertinent items are noted in HPI.  Objective:  Physical Exam BP 174/98 mmHg  Pulse 80  Wt 132 lb (59.875 kg) GENERAL: Well-developed, well-nourished female in no acute distress.  HEENT: Normocephalic, atraumatic.  NECK: Supple. Normal thyroid.  LUNGS: Normal rate. Clear to auscultation bilaterally.  HEART: Regular rate and rhythm with no adventitious sounds.  ABDOMEN: Normal bowel sounds appreciated in all quadrants.  Left side is soft, nontender, nondistended.   Right side with a firmness noted in RLQ from umbilicus level down.  Pain is specific location at halfway point within this firm region.   PELVIC: Normal external female genitalia. Vagina is pink and rugated.   Normal cervix contour with erythema surrounding os and friability noted. Yellow mucus noted in cervical os.  Moderate amt of white discharge with froth noted.   Uterus is normal in size. No adnexal mass or tenderness.  EXTREMITIES: No cyanosis, clubbing, or edema, 2+  distal pulses. Skin with extensive scarring from previous surgeries   Labs and Imaging No results found.  Assessment & Plan:  Assessment: 1. Cervicitis   2. Pelvic pain in female   3.      Elevated blood pressure   Plans: See PCP regarding elevated blood pressure asap Presumptive treatment for GC/ BV will cover trich/chlamydia No sex and no alcohol x 10 days Pelvic ultrasound to be scheduled to evaluate further Return to clinic PRN   Bertram DenverKaren E Teague Clark, PA-C 02/09/2014 2:10 PM

## 2014-02-09 NOTE — Patient Instructions (Signed)
Cervicitis °Cervicitis is a soreness and swelling (inflammation) of the cervix. Your cervix is located at the bottom of your uterus. It opens up to the vagina. °CAUSES  °· Sexually transmitted infections (STIs).   °· Allergic reaction.   °· Medicines or birth control devices that are put in the vagina.   °· Injury to the cervix.   °· Bacterial infections.   °RISK FACTORS °You are at greater risk if you: °· Have unprotected sexual intercourse. °· Have sexual intercourse with many partners. °· Began sexual intercourse at an early age. °· Have a history of STIs. °SYMPTOMS  °There may be no symptoms. If symptoms occur, they may include:  °· Gray, white, yellow, or bad-smelling vaginal discharge.   °· Pain or itching of the area outside the vagina.   °· Painful sexual intercourse.   °· Lower abdominal or lower back pain, especially during intercourse.   °· Frequent urination.   °· Abnormal vaginal bleeding between periods, after sexual intercourse, or after menopause.   °· Pressure or a heavy feeling in the pelvis.   °DIAGNOSIS  °Diagnosis is made after a pelvic exam. Other tests may include:  °· Examination of any discharge under a microscope (wet prep).   °· A Pap test.   °TREATMENT  °Treatment will depend on the cause of cervicitis. If it is caused by an STI, both you and your partner will need to be treated. Antibiotic medicines will be given.  °HOME CARE INSTRUCTIONS  °· Do not have sexual intercourse until your health care provider says it is okay.   °· Do not have sexual intercourse until your partner has been treated, if your cervicitis is caused by an STI.   °· Take your antibiotics as directed. Finish them even if you start to feel better.   °SEEK MEDICAL CARE IF: °· Your symptoms come back.   °· You have a fever.   °MAKE SURE YOU:  °· Understand these instructions. °· Will watch your condition. °· Will get help right away if you are not doing well or get worse. °Document Released: 03/04/2005 Document Revised:  03/09/2013 Document Reviewed: 08/26/2012 °Exit[employer]® Patient Information ©2015 [employer]. This information is not intended to replace advice given to you by your health care provider. Make sure you discuss any questions you have with your health care provider. ° °

## 2014-02-10 LAB — WET PREP FOR TRICH, YEAST, CLUE
CLUE CELLS WET PREP: NONE SEEN
TRICH WET PREP: NONE SEEN
YEAST WET PREP: NONE SEEN

## 2014-02-10 LAB — GC/CHLAMYDIA PROBE AMP
CT Probe RNA: NEGATIVE
GC Probe RNA: NEGATIVE

## 2014-02-18 ENCOUNTER — Ambulatory Visit (HOSPITAL_COMMUNITY)
Admission: RE | Admit: 2014-02-18 | Discharge: 2014-02-18 | Disposition: A | Discharge status: Home / Self Care | Payer: Medicare Other | Source: Ambulatory Visit | Attending: Physician Assistant | Admitting: Physician Assistant

## 2014-02-18 ENCOUNTER — Other Ambulatory Visit: Payer: Self-pay | Admitting: Physician Assistant

## 2014-02-18 DIAGNOSIS — N949 Unspecified condition associated with female genital organs and menstrual cycle: Secondary | ICD-10-CM | POA: Insufficient documentation

## 2014-02-18 DIAGNOSIS — Z975 Presence of (intrauterine) contraceptive device: Secondary | ICD-10-CM | POA: Diagnosis not present

## 2014-02-18 DIAGNOSIS — N8320 Unspecified ovarian cysts: Secondary | ICD-10-CM | POA: Insufficient documentation

## 2014-02-18 DIAGNOSIS — R102 Pelvic and perineal pain: Secondary | ICD-10-CM

## 2014-02-18 DIAGNOSIS — Z940 Kidney transplant status: Secondary | ICD-10-CM | POA: Diagnosis not present

## 2014-04-07 ENCOUNTER — Telehealth: Payer: Self-pay | Admitting: *Deleted

## 2014-04-07 DIAGNOSIS — N83202 Unspecified ovarian cyst, left side: Secondary | ICD-10-CM

## 2014-04-07 NOTE — Telephone Encounter (Signed)
Patient is calling because her Transplant Clinic for her Kidney has recommended that she get a repeat of the ultrasound that was done to be sure that the ovarian cyst on her left ovary has not changed.  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  Her regular nephrologist is Dr. Jerlyn Lyodd Robinson.  I have placed the order and patient will call to schedule the appointment.

## 2014-07-03 ENCOUNTER — Inpatient Hospital Stay
Admit: 2014-07-03 | Disposition: A | Discharge status: Home / Self Care | Payer: Self-pay | Attending: Internal Medicine | Admitting: Internal Medicine

## 2014-07-03 DIAGNOSIS — I361 Nonrheumatic tricuspid (valve) insufficiency: Secondary | ICD-10-CM

## 2014-07-03 DIAGNOSIS — R7989 Other specified abnormal findings of blood chemistry: Secondary | ICD-10-CM | POA: Diagnosis not present

## 2014-07-03 LAB — COMPREHENSIVE METABOLIC PANEL
ALT: 12 U/L — AB
AST: 27 U/L
Albumin: 3.3 g/dL — ABNORMAL LOW
Alkaline Phosphatase: 54 U/L
Anion Gap: 16 (ref 7–16)
BUN: 21 mg/dL — AB
Bilirubin,Total: 0.8 mg/dL
Calcium, Total: 8.6 mg/dL — ABNORMAL LOW
Chloride: 106 mmol/L
Co2: 18 mmol/L — ABNORMAL LOW
Creatinine: 3.7 mg/dL — ABNORMAL HIGH
EGFR (African American): 17 — ABNORMAL LOW
GFR CALC NON AF AMER: 15 — AB
Glucose: 126 mg/dL — ABNORMAL HIGH
POTASSIUM: 3.7 mmol/L
Sodium: 140 mmol/L
TOTAL PROTEIN: 5.9 g/dL — AB

## 2014-07-03 LAB — CBC
HCT: 36.4 % (ref 35.0–47.0)
HGB: 11.2 g/dL — ABNORMAL LOW (ref 12.0–16.0)
MCH: 30.2 pg (ref 26.0–34.0)
MCHC: 30.6 g/dL — AB (ref 32.0–36.0)
MCV: 99 fL (ref 80–100)
Platelet: 437 10*3/uL (ref 150–440)
RBC: 3.7 10*6/uL — ABNORMAL LOW (ref 3.80–5.20)
RDW: 19.5 % — ABNORMAL HIGH (ref 11.5–14.5)
WBC: 8.5 10*3/uL (ref 3.6–11.0)

## 2014-07-03 LAB — TROPONIN I
TROPONIN-I: 0.05 ng/mL — AB
TROPONIN-I: 0.04 ng/mL — AB
Troponin-I: 0.05 ng/mL — ABNORMAL HIGH

## 2014-07-03 LAB — PHOSPHORUS: PHOSPHORUS: 7 mg/dL — AB

## 2014-07-04 LAB — BASIC METABOLIC PANEL
Anion Gap: 10 (ref 7–16)
BUN: 26 mg/dL — AB
CREATININE: 4.28 mg/dL — AB
Calcium, Total: 8.1 mg/dL — ABNORMAL LOW
Chloride: 106 mmol/L
Co2: 22 mmol/L
EGFR (African American): 14 — ABNORMAL LOW
EGFR (Non-African Amer.): 12 — ABNORMAL LOW
GLUCOSE: 90 mg/dL
Potassium: 4.5 mmol/L
SODIUM: 138 mmol/L

## 2014-07-04 LAB — HEMOGLOBIN: HGB: 9.5 g/dL — ABNORMAL LOW (ref 12.0–16.0)

## 2014-07-04 LAB — HCG, QUANTITATIVE, PREGNANCY: Beta Hcg, Quant.: 1 m[IU]/mL

## 2014-07-05 LAB — BASIC METABOLIC PANEL
Anion Gap: 10 (ref 7–16)
BUN: 36 mg/dL — ABNORMAL HIGH
CHLORIDE: 104 mmol/L
CO2: 23 mmol/L
Calcium, Total: 7.8 mg/dL — ABNORMAL LOW
Creatinine: 5.6 mg/dL — ABNORMAL HIGH
EGFR (African American): 10 — ABNORMAL LOW
EGFR (Non-African Amer.): 9 — ABNORMAL LOW
GLUCOSE: 87 mg/dL
Potassium: 4.8 mmol/L
Sodium: 137 mmol/L

## 2014-07-05 LAB — HEMOGLOBIN: HGB: 7.5 g/dL — ABNORMAL LOW (ref 12.0–16.0)

## 2014-07-05 LAB — PHOSPHORUS: Phosphorus: 9.3 mg/dL — ABNORMAL HIGH

## 2014-07-05 LAB — PLATELET COUNT: PLATELETS: 326 10*3/uL (ref 150–440)

## 2014-07-06 LAB — HEMOGLOBIN: HGB: 6.5 g/dL — AB (ref 12.0–16.0)

## 2014-07-06 LAB — HEMOGLOBIN A1C: HEMOGLOBIN A1C: 5.3 %

## 2014-07-06 LAB — PLATELET COUNT: Platelet: 283 10*3/uL (ref 150–440)

## 2014-07-07 LAB — CBC WITH DIFFERENTIAL/PLATELET
Basophil #: 0 10*3/uL (ref 0.0–0.1)
Basophil %: 0.6 %
Eosinophil #: 0.2 10*3/uL (ref 0.0–0.7)
Eosinophil %: 2.6 %
HCT: 21.4 % — AB (ref 35.0–47.0)
HGB: 6.5 g/dL — ABNORMAL LOW (ref 12.0–16.0)
LYMPHS PCT: 4.1 %
Lymphocyte #: 0.3 10*3/uL — ABNORMAL LOW (ref 1.0–3.6)
MCH: 29.1 pg (ref 26.0–34.0)
MCHC: 30.6 g/dL — AB (ref 32.0–36.0)
MCV: 95 fL (ref 80–100)
MONO ABS: 0.9 x10 3/mm (ref 0.2–0.9)
Monocyte %: 11.5 %
NEUTROS PCT: 81.2 %
Neutrophil #: 6.4 10*3/uL (ref 1.4–6.5)
Platelet: 336 10*3/uL (ref 150–440)
RBC: 2.24 10*6/uL — ABNORMAL LOW (ref 3.80–5.20)
RDW: 17.4 % — AB (ref 11.5–14.5)
WBC: 7.9 10*3/uL (ref 3.6–11.0)

## 2014-07-07 LAB — HEMOGLOBIN: HGB: 6.3 g/dL — AB (ref 12.0–16.0)

## 2014-07-07 LAB — PHOSPHORUS: Phosphorus: 6.8 mg/dL — ABNORMAL HIGH

## 2014-07-08 LAB — CBC WITH DIFFERENTIAL/PLATELET
BASOS ABS: 0.1 10*3/uL (ref 0.0–0.1)
Basophil %: 0.7 %
EOS PCT: 3.4 %
Eosinophil #: 0.3 10*3/uL (ref 0.0–0.7)
HCT: 26.4 % — ABNORMAL LOW (ref 35.0–47.0)
HGB: 8.2 g/dL — ABNORMAL LOW (ref 12.0–16.0)
LYMPHS ABS: 0.2 10*3/uL — AB (ref 1.0–3.6)
Lymphocyte %: 2.9 %
MCH: 29.1 pg (ref 26.0–34.0)
MCHC: 31.1 g/dL — ABNORMAL LOW (ref 32.0–36.0)
MCV: 94 fL (ref 80–100)
MONO ABS: 0.9 x10 3/mm (ref 0.2–0.9)
Monocyte %: 10.8 %
NEUTROS ABS: 6.7 10*3/uL — AB (ref 1.4–6.5)
Neutrophil %: 82.2 %
Platelet: 378 10*3/uL (ref 150–440)
RBC: 2.82 10*6/uL — ABNORMAL LOW (ref 3.80–5.20)
RDW: 17.3 % — AB (ref 11.5–14.5)
WBC: 8.2 10*3/uL (ref 3.6–11.0)

## 2014-07-08 LAB — POTASSIUM: Potassium: 4.1 mmol/L

## 2014-07-08 LAB — WOUND CULTURE

## 2014-07-11 LAB — SURGICAL PATHOLOGY

## 2014-07-17 NOTE — Consult Note (Signed)
PATIENT NAME:  [name], [name] MR#:  [id number] DATE OF BIRTH:  [birth year]  DATE OF CONSULTATION:  07/03/2014  REFERRING PHYSICIAN:   CONSULTING PHYSICIAN:  Leitha SchullerMichael J. Tyiesha Brackney, MD  REASON FOR CONSULT: Right hip fracture.   HISTORY OF PRESENT ILLNESS: The patient is a 39 year old female who has a history of end-stage renal disease. 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 She is diabetic as well. She suffers from seizure disorder and had one while in the Emergency Room while her daughter is being evaluated and treated. She was admitted and noted to have right thigh pain.  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 She denies prodromal symptoms of hip pain, nothing that sounds like a stress fracture. She has been a Tourist information centre managercommunity ambulator.  Takes care of her 39-year-old daughter [place]. She has been generally active and ambulates without an assistive device.  REVIEW OF SYSTEMS: Positive for right hip pain. She denies other joint or musculoskeletal complaints.   PHYSICAL EXAMINATION: On examination, she has palpable dorsalis pedis and posterior tibial pulses. She is able to flex and extend her toes and has intact sensation to the plantar and dorsal aspect of the foot without evidence of neuropathy. Her right thigh skin is intact. There is mild swelling. She has tenderness with any motion. X-rays reveal a completely displaced femoral neck fracture.   CLINICAL IMPRESSION:  A 39 year old with significant health problems with a completely displaced femoral neck fracture.  At her age and activity level, hemiarthroplasty is unlikely to give long-term relief and total hip arthroplasty is indicated. Will plan on direct anterior approach tomorrow if medically stable for the procedure.  The risks, benefits, and possible  complications were discussed with her, in particular a higher risk for infection with her diabetes, prednisone, and antirejection medication, but the alternative is that she would not be able to ambulate with a completely displaced neck fracture as her femoral head is likely would almost certainly go on to avascular necrosis even if we did do an open reduction with internal fixation. She understands this and will plan on surgery tomorrow, again if medically stable.  Site marked today.   ____________________________ Leitha SchullerMichael J. Amirah Goerke, MD mjm:sp D: 07/03/2014 16:48:31 ET T: 07/03/2014 17:46:09 ET JOB#: [id number]  cc: Leitha SchullerMichael J. Deitrick Ferreri, MD, <Dictator> Leitha SchullerMICHAEL J Waylon Hershey MD ELECTRONICALLY SIGNED 07/04/2014 0:37

## 2014-07-17 NOTE — Op Note (Signed)
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   PROCEDURE:  Right direct anterior total hip replacement.   ANESTHESIA:  General.   SURGEON:  Leitha SchullerMichael J. Vyron Fronczak, MD   DESCRIPTION OF PROCEDURE:  The patient was brought to the operating room, and after adequate general anesthesia was obtained, she was placed on the operative table with the right foot in the Medacta attachment, left leg on a well-padded table. Traction was applied and initial C-arm view obtained off of this traction view for subsequent comparison to implant. The hip was prepped and draped in the usual sterile fashion with appropriate patient identification and timeout procedures completed before the start of the case. Incision was centered over the greater trochanter and TFL. Incision was carried down through the skin and subcutaneous tissue. The TFL was incised and the muscle retracted laterally. Muscle was edematous and poor contractility. Next, the deep fascia was incised and lateral femoral circle vessels ligated. The capsule was exposed and the capsulotomy performed. A neck cut was carried, out and the neck below the fracture was removed. The head was then removed with some areas of significant degenerative change centrally. At this point, the acetabulum was well visualized. There was synovitis within the pulvinar and this was excised. Reaming was carried out to 48 mm, at which point there was good bleeding bone. A 48 mm trial fit well and a 48 mm Versafitcup DM was impacted into place and was stable. The leg was externally rotated and pubofemoral and ischiofemoral releases carried out. Sequential broaching was carried out up to a size 3, which gave a tight fit. With trials, an S head  appeared to give restoration of leg length and offset. The final components were assembled with the 3 stem impacted down the canal and the S-28 mm head and liner for the Versafitcup DM assembled and impacted onto the head. The hip was then reduced and was stable to 90 degrees of external rotation. The wound was then closed after thorough irrigation with first dilute Betadine solution and then antibiotic solution. Next, the deep fascia was repaired using a heavy quill, 2-0 quill subcutaneously, and skin staples. Xeroform, 4 x 4's, ABD, and tape were applied. The patient was sent to the recovery room in stable condition.   ESTIMATED BLOOD LOSS:  400 mL.   COMPLICATIONS:  None.   SPECIMEN REMOVED:  Femoral head and neck.   IMPLANTS:  Medacta Versafitcup DM size 48, Amis stem size 3, a 28 mm S head, and a liner for the 48 mm Versafitcup DM.    ____________________________ Leitha SchullerMichael J. Anorah Trias, MD mjm:nb D: 07/04/2014 20:52:18 ET T: 07/04/2014 22:24:29 ET JOB#: [id number]  cc: Leitha SchullerMichael J. Victor Langenbach, MD, <Dictator> Nolon BussingMICHAEL J [facility] MD ELECTRONICALLY SIGNED 07/05/2014 1:11

## 2014-07-17 NOTE — Consult Note (Signed)
Brief Consult Note: Diagnosis: right femoral neck fracture.   Patient was seen by consultant.   Consult note dictated.   Recommend to proceed with surgery or procedure.   Orders entered.   Comments: recommend THA tomorrow if medically stable.  Electronic Signatures: Leitha SchullerMenz, Aison Malveaux J (MD)  (Signed 17-Apr-16 12:58)  Authored: Brief Consult Note   Last Updated: 17-Apr-16 12:58 by Leitha SchullerMenz, Laverne Hursey J (MD)

## 2014-07-17 NOTE — Discharge Summary (Addendum)
PATIENT NAME:  [name], [name] MR#:  [id number] DATE OF BIRTH:  Jan 25, 1976  DATE OF ADMISSION:  07/03/2014 DATE OF DISCHARGE:  07/08/2014  DISCHARGE DIAGNOSES:   1. (right hip fracture status post surgery with right hip replacement. 2. Seizure disorder. 3. End-stage renal disease on hemodialysis. 4. 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  5. Accelerated hypertension.   DISCHARGE MEDICATIONS:  1. Lamictal XR 300 mg daily.  2. Omeprazole 20 mg daily.  3. Zyrtec 10 mg daily. 4. Labetalol 100 mg p.o. b.i.d.  5. Myfortic 180 mg 4 tablets 2 times daily.  6. Prograf 1 mg p.o. 3 capsules p.o. q.12 hours. 7. Oxycodone 50 mg 1/2 tablet every 6 hours as needed for pain.  8. Ondansetron 4 mg 1 tablet every 8 hours as needed for nausea.  9. Excedrin Migraine as needed for headache. She can take 2 tablets every 6 hours.  10. Nucynta 100 mg, 1 tablet every 4 hours as needed for moderate pain.  11. Bisacodyl 10 mg as needed for constipation.  12. Aspirin 320 mg p.o. daily for 14 days.   DIET:   Renal diet.   CONSULTATIONS:  1. Orthopedic consult with Kennedy Bucker, MD.  2. Neurology consult with Mellody Drown, MD.  3. Nephrology consult with Mosetta Pigeon, MD. 4. Physical therapy consult.    Orthopedic  instruction for weightbearing: The patient's advised to increasing weightbearing on affected extremity and  t     discharged her with home physical therapy and will see orthopedic  in 2 weeks at [facility] and  she is on hemodialysis Monday, Wednesday, Friday schedule.   HOSPITAL COURSE: A 39 year old female patient admitted because of seizure.   The patient was  visiting her daughter in the Emergency Room and she developed seizure.  1. She is admitted because of seizure. The patient's CT head was unremarkable and she had  elevated blood pressure of 218/109 in the ER and the patient admitted to hospitalist service for a seizure episode and elevated blood pressure.  The patient was continued on Lamictal but we did not have Lamictal XR so  we gave her Lamictal 150 mg b.i.d. and followed the neurology checks and neurology consult with Dr. Mellody Drown.   2. During the seizure episode, she fell and broke her hip.  The patient's x-ray of the right femur showed displaced right femoral neck fracture. She was given pain medications with IV Dilaudid 2 mg q. 4 hours, seen by orthopedic, Dr. Rosita Kea, and  was taken to  OR on the 18th  for a total hip replacement on the right side. Postoperatively, she tolerated the procedure well and seen by physical therapy and right now she walked a little bit yesterday and we will discharge her home with home physical therapy. The patient will follow up with [facility] neuroin 2  weeks.  3. Regarding DVT prophylaxis, she said she could not take Lovenox.  We will discharge her with aspirin 325 mg daily for 14 days as per  orthopedic recommendations and > and oxycodone prescriptions for pain control.  4. ESRD on hemodialysis followed b  nephrology, from [place]. .  Here, Dr. Wynelle Link and  Dr. Thedore Mins followed the patient and she has had a history of kidney transplant, but it failed and she is back on dialysis since January 2016.  She also has a history of pancreas transplant and it is  working and she is on chronic immunosuppressant.   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  5. Anemia of chronic kidney disease. ESRD;on HD  .The patient developed postoperative blood loss anemia, hemoglobin as low as 6.5. The patient received 1 unit of transfusion. Repeat hemoglobin improved to 8.5. The patient's hemoglobin is around 9.5.     LABORATORY DATA: Her wound cultures have been negative. Blood cultures negative. Dr. Katrinka BlazingSmith recommended to continue Lamictal 300 mg daily, Lamictal XR and the patient had a well controlled history. The patient had history of well  controlled seizures and last seizure was 4 yrs ago.seen by neurology DR.Mathew Smith,he mentionedcthat  that > seizure could have been provoked probably by stress  and he signed off on the case. The patient did not have any further seizures. The patient had slightly elevated troponins of 0.05, seen by cardiology but patient did not have chest pain, no history of CAD, no EKG changes and the patient's echo showed EF more than 60% and normal LV function.   He cleared her for surgery.  Marland Kitchen.    PHYSICAL EXAMINATION AT THE TIME OF DISCHARGE:   VITALS:   Temperature 98.2 Fahrenheit, heart rate 94, blood pressure 164/91, saturations 96% on room air.  CARDIAC:  s1,s2 regular no murmur.  LUNGS: Clear to auscultation.  ABDOMEN:   Soft, nontender, nondistended.   BS presnrt,   The patient refused stool softeners and the patient's abdomen was soft, nontender (nd.bs present, >> discharged her home.   TIME SPENT:   More than 30 minutes.      ____________________________ Katha HammingSnehalatha Detravion Tester, MD sk:tr D: 07/09/2014 07:25:00 ET T: 07/09/2014 12:45:12 ET JOB#: [id number]  cc: Katha HammingSnehalatha Antanasia Kaczynski, MD, <Dictator> Katha HammingSNEHALATHA Masiel Gentzler MD ELECTRONICALLY SIGNED 07/19/2014 23:23

## 2014-07-17 NOTE — H&P (Signed)
PATIENT NAME:  [name], [name] MR#:  [id number] DATE OF BIRTH:  September 02, 1975  DATE OF ADMISSION:  07/03/2014  REFERRING DOCTOR: Rebecka ApleyAllison P. Webster, M.D.   PRIMARY CARE PRACTITIONER: Elizabeth Palaueresa Anderson, FNP.  ADMITTING PHYSICIAN: Crissie FiguresEdavally N. Janine Reller, M.D.    CHIEF COMPLAINT:  Developed seizure episode while waiting in the ED attending her sick daughter.   HISTORY OF PRESENT ILLNESS: A 39 year old, Caucasian female, with a history of hypertension, end-stage renal disease on hemodialysis, seizure disorder, diabetes mellitus status post pancreatic transplantation. While attending to her sick daughter in the Emergency Room, and while waiting in the Emergency Room, she had an episode of seizure activity, which was witnessed by the ED physician. The patient regained her consciousness on her own, and following a short postictal period, the patient is back to herself. The patient has a history of seizure disorder. Did not miss any of her medications, and the last seizure episode was about 4 years ago. The patient was evaluated by the ED physician and laboratories showed mildly elevated troponin, and also the patient was noted to have accelerated hypertension. The patient underwent a CT of the head, a noncontrast CT, which was negative for any intracranial pathology. EKG showed sinus tachycardia with ventricular rate of 104 beats per minute, no acute ST changes. The patient denies any chest pain. No history of any cardiac problems in the past. In view of elevated blood pressure, the patient received IV metoprolol following which her blood pressure slightly improved, but still she has elevated blood pressure. In view of elevated blood pressures along with elevated troponin, the hospitalist service was consulted for further management. The patient is comfortably resting in the bed at this time. Denies any chest pain, shortness of breath, dizziness, focal weakness or numbness. Denies any GI symptoms such as nausea, vomiting,  diarrhea, constipation, abdominal pain. She does have some right thigh pain following a fall, which she sustained during that seizure actively.   PAST MEDICAL HISTORY: 1. Hypertension.  2. Diabetes mellitus status post pancreatic transplantation.  3. End-stage renal disease on hemodialysis Tuesday, Thursday, Saturday. Last hemodialysis on Friday.  4. Seizure disorder.   PAST SURGICAL HISTORY: 1. C-section.  2. Status post pancreatic transplantation.  3. 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  4. Multiple orthopedic surgeries secondary to injuries.   FAMILY HISTORY: Father with history of hypertension.   SOCIAL HISTORY: She is single, lives at home. Denies any history of smoking, alcohol or substance abuse.   ALLERGIES:  1. QUININE.  2. NAPROXEN. 3. KEPPRA.  4 Entex  LA.  5. BIAXIN.  6. LOVENOX.   HOME MEDICATIONS: 1. Labetalol 100 mg 1 tablet orally 2 times a day.  2. Lamictal XR 300 mg extended-release tablet, 1 tablet orally once a day.  3. Myfortic 720 mg 1 tablet orally 2 times a day.  4. Omeprazole 20 mg 1 tablet orally once a day.  5. Prograf 3 mg orally 2 times a day.  6. Zyrtec 10 mg 1 tablet orally once a day.   REVIEW OF SYSTEMS:  CONSTITUTIONAL: Negative for fever, chills, fatigue, or generalized weakness.  EYES: Negative for blurred vision, double vision. No pain. No redness. No discharge. ENT: Negative for ear tinnitus, ear pain, hearing loss, epistaxis, nasal discharge, difficulty swallowing.  RESPIRATORY: Negative for cough, wheezing, dyspnea, hemoptysis, or painful respiration.  CARDIOVASCULAR: Negative for chest pain, palpitations, dizziness, syncopal episodes, orthopnea, dyspnea on exertion, or pedal edema. GASTROINTESTINAL: Negative for nausea, vomiting, diarrhea, constipation,  hematemesis, melena, rectal bleeding.  GENITOURINARY: Negative for dysuria, frequency, urgency, or hematuria.  ENDOCRINE: Negative for polyuria, nocturia,  heat or cold intolerance.  HEMATOLOGIC AND LYMPHATIC: Negative anemia, easy bruising or bleeding, or swollen glands.  INTEGUMENTARY: Negative for skin rash or lesions.  MUSCULOSKELETAL: Positive for right thigh pain following a fall sustained during seizure activity.  NEUROLOGIC: History of seizure disorder. Developed seizure episode while waiting in the Emergency Room, as mentioned in the history of present illness. PSYCHIATRIC: Negative for anxiety, insomnia, or depression.   PHYSICAL EXAMINATION:  VITAL SIGNS: Temperature 98 degrees Fahrenheit, pulse rate 106 per minute, respirations 20 per minute, blood pressure initially 218/109, current blood pressure 182/103.  GENERAL: Well-nourished, thin built, alert, comfortable, lying resting in the bed and in no acute distress.  HEAD: Atraumatic, normocephalic.  EYES: Pupils are equal, react to light and accommodation. No conjunctival pallor. No icterus. Extraocular movements are intact.  NOSE: No drainage.  EARS: No drainage.  ORAL CAVITY: No mucosal lesions. No exudates.  NECK: Supple. No JVD. No thyromegaly. No carotid bruit. Range of motion of the neck is within normal limits.  RESPIRATORY: Good respiratory effort. Not using accessory muscles of respiration. Bilateral vesicular breath sounds and no rales or rhonchi.  CARDIOVASCULAR: S1, S2 regular. No murmurs, gallops, or clicks. Pulses equal at carotid, femoral, pedal pulses, no pedal edema.  GASTROINTESTINAL: Abdomen is soft, nontender. No hepatosplenomegaly. No masses noted, no guarding. Bowel sounds present and equal in all 4 quadrants.  GENITOURINARY: Deferred.  MUSCULOSKELETAL: Tenderness over right thigh with decreased range of motion of the right lower extremity, power equal bilaterally.  SKIN: Inspection within normal limits.  LYMPHATIC: No cervical lymphadenopathy.  VASCULAR: Good dorsalis pedis and posterior tibial pulses.  NEUROLOGIC: Alert, awake, and oriented x 3. Cranial nerves  II through XII grossly intact. No sensory deficit. Motor strength 5/5 in both upper and lower extremities. DTRs 2+ bilaterally and symmetrical. Plantars are downgoing.  PSYCHIATRIC: Alert, awake, and oriented x 3. Judgment and insight adequate. Memory and mood within normal limits.    LABORATORY DATA: Serum glucose 126, BUN 21, creatinine 3.70, sodium 140, potassium 3.7, chloride 106, bicarbonate 18, total calcium 8.6, total protein 5.9, albumin 3.3, total bilirubin 0.8, alkaline phosphatase 54, AST 27, ALT 12. Troponin 0.05. WBC 8.3, hemoglobin 11.2, hematocrit 36.4, platelet count 437,000.   IMAGING STUDIES:  CT of the head, noncontrast study:  1. No acute or traumatic intracranial findings.  2. Right scalp contusion without fracture.   EKG: Sinus tachycardia with ventricular rate of 104 beats per minute, no acute ST-T changes.   ASSESSMENT AND PLAN: A 39 year old, Caucasian female, with a history of seizure disorder, end-stage renal disease on hemodialysis, Tuesday, Thursday, Saturday, last hemodialysis on Friday, hypertension, diabetes mellitus status post pancreatic transplantation, developed seizure episode while waiting in the Emergency Room attending her sick daughter, which was witnessed by the ED physician. Was also noted to have elevated blood pressure of 218/109 and work-up revealed a mildly elevated troponin.   1. Seizure episode while waiting in the Emergency Department attending her sick daughter. History of seizure disorder on Lamictal XR. Last seizure episode 4 years ago. The patient is neurologically stable. PLAN: Admit, neurologic watch, continue Lamictal XR. Neurology consultation requested for further advice.  2. Elevated blood pressure, history of hypertension. accelerated  hypertension. Received IV metoprolol in the Emergency Department. The patient is hemodynamically stable. Continue home medications and use IV hydralazine p.Stein.n. for elevated blood pressure. Continue home  medications.  3. End-stage renal disease on hemodialysis Tuesday, Thursday and Saturday. The patient had undergone last hemodialysis on Friday, stable clinically. PLAN: Nephrology consultation for follow-up of hemodialysis.  4. Elevated troponin, mild, no chest pain. EKG: No acute ischemic changes. No history of any coronary artery disease in the past. Likely demand ischemia secondary to uncontrolled hypertension. PLAN: Admit to telemetry. Continue beta blocker, aspirin. No heparin because of ALLERGY TO LOVENOX, cycle cardiac enzymes. Request echocardiogram and cardiology consultation.  5. Right thigh pain following a fall sustained during seizure episode. We will get x-rays of the right femur to rule out any fracture.  6. Diabetes mellitus, status post pancreatic transplantation, not on any medications at present. PLAN: Sliding scale insulin, monitor.  7. Chronic immunosuppressive drug treatment following pancreatic transplantation, stable. Continue home medications.  8. Deep vein thrombosis prophylaxis. TED and sequential compression devices. No Lovenox because of history of allergy.  9. Gastrointestinal prophylaxis, proton pump inhibitor.   CODE STATUS: FULL CODE.   TIME SPENT: 50 minutes.    ____________________________ Crissie Figures, MD enr:JT D: 07/03/2014 08:02:53 ET T: 07/03/2014 09:08:43 ET JOB#: [id number]  cc: Crissie Figures, MD, <Dictator> Elizabeth Palau, FNP   Crissie Figures MD ELECTRONICALLY SIGNED 07/03/2014 22:12
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
## 2014-07-29 ENCOUNTER — Ambulatory Visit
Admission: RE | Admit: 2014-07-29 | Discharge: 2014-07-29 | Disposition: A | Discharge status: Home / Self Care | Payer: Medicare Other | Source: Ambulatory Visit | Attending: Orthopedic Surgery | Admitting: Orthopedic Surgery

## 2014-07-29 ENCOUNTER — Other Ambulatory Visit: Payer: Self-pay | Admitting: Orthopedic Surgery

## 2014-07-29 DIAGNOSIS — M7989 Other specified soft tissue disorders: Secondary | ICD-10-CM | POA: Diagnosis present

## 2014-07-29 DIAGNOSIS — M79604 Pain in right leg: Secondary | ICD-10-CM

## 2014-07-29 DIAGNOSIS — Z96641 Presence of right artificial hip joint: Secondary | ICD-10-CM | POA: Insufficient documentation

## 2014-07-29 DIAGNOSIS — L539 Erythematous condition, unspecified: Secondary | ICD-10-CM

## 2014-12-13 ENCOUNTER — Encounter: Payer: Self-pay | Admitting: *Deleted

## 2014-12-13 ENCOUNTER — Emergency Department: Payer: Medicare Other

## 2014-12-13 ENCOUNTER — Emergency Department
Admission: EM | Admit: 2014-12-13 | Discharge: 2014-12-13 | Disposition: A | Discharge status: Home / Self Care | Payer: Medicare Other | Attending: Emergency Medicine | Admitting: Emergency Medicine

## 2014-12-13 DIAGNOSIS — Z91040 Latex allergy status: Secondary | ICD-10-CM | POA: Diagnosis not present

## 2014-12-13 DIAGNOSIS — Y998 Other external cause status: Secondary | ICD-10-CM | POA: Insufficient documentation

## 2014-12-13 DIAGNOSIS — M79644 Pain in right finger(s): Secondary | ICD-10-CM

## 2014-12-13 DIAGNOSIS — W010XXA Fall on same level from slipping, tripping and stumbling without subsequent striking against object, initial encounter: Secondary | ICD-10-CM | POA: Insufficient documentation

## 2014-12-13 DIAGNOSIS — IMO0001 Reserved for inherently not codable concepts without codable children: Secondary | ICD-10-CM

## 2014-12-13 DIAGNOSIS — S6991XA Unspecified injury of right wrist, hand and finger(s), initial encounter: Secondary | ICD-10-CM | POA: Diagnosis present

## 2014-12-13 DIAGNOSIS — Y9389 Activity, other specified: Secondary | ICD-10-CM | POA: Insufficient documentation

## 2014-12-13 DIAGNOSIS — Z791 Long term (current) use of non-steroidal anti-inflammatories (NSAID): Secondary | ICD-10-CM | POA: Diagnosis not present

## 2014-12-13 DIAGNOSIS — E119 Type 2 diabetes mellitus without complications: Secondary | ICD-10-CM | POA: Diagnosis not present

## 2014-12-13 DIAGNOSIS — Z79899 Other long term (current) drug therapy: Secondary | ICD-10-CM | POA: Insufficient documentation

## 2014-12-13 DIAGNOSIS — S66911A Strain of unspecified muscle, fascia and tendon at wrist and hand level, right hand, initial encounter: Secondary | ICD-10-CM | POA: Insufficient documentation

## 2014-12-13 DIAGNOSIS — Y9289 Other specified places as the place of occurrence of the external cause: Secondary | ICD-10-CM | POA: Insufficient documentation

## 2014-12-13 NOTE — Discharge Instructions (Signed)
Thumb Sprain Your exam shows you have a sprained thumb. This means the ligaments around the joint have been torn. Thumb sprains usually take 3-6 weeks to heal. However, severe, unstable sprains may need to be fixed surgically. Sometimes a small piece of bone is pulled off by the ligament. If this is not treated properly, a sprained thumb can lead to a painful, weak joint. Treatment helps reduce pain and shortens the period of disability. The thumb, and often the wrist, must remain splinted for the first 2-4 weeks to protect the joint. Keep your hand elevated and apply ice packs frequently to the injured area (20-30 minutes every 2-3 hours) for the next 2-4 days. This helps reduce swelling and control pain. Pain medicine may also be used for several days. Motion and strengthening exercises may later be prescribed for the joint to return to normal function. Be sure to see your doctor for follow-up because your thumb joint may require further support with splints, bandages or tape. Please see your doctor or go to the emergency room right away if you have increased pain despite proper treatment, or a numb, cold, or pale thumb. Document Released: 04/11/2004 Document Revised: 05/27/2011 Document Reviewed: 03/05/2008 [facility] Patient Information 2015 [place], [place]. This information is not intended to replace advice given to you by your health care provider. Make sure you discuss any questions you have with your health care provider.  Wear the splint for a few days until symptoms improve. Apply ice and take ibuprofen or naproxen as needed. Follow-up with Dr. Joice Lofts for ongoing symptoms.
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
## 2014-12-13 NOTE — ED Provider Notes (Signed)
[facility] Emergency Department [name] Note ____________________________________________  Time seen: 1825  I have reviewed the triage vital signs and the nursing notes.  HISTORY  Chief Complaint  Finger Injury and Head Injury  HPI [name] is a 39 y.o. female ports to the ED for evaluation of thumb sprain after a fall about 5 days prior. She describes tripping over some select outside and falling for with outstretched hand. She has since that time had pain and weakness with fine pinching movement maneuvers of the hand.  Past Medical History  Diagnosis Date  . Allergy   . Anxiety   . Cataract   . Diabetes mellitus without complication     history of/had transplant/tn  . Heart murmur   . Chronic kidney disease 04/08/2011    Kidney transplant  . Seizures     Patient Active Problem List   Diagnosis Date Noted  . Cervicitis 02/09/2014  . Pelvic pain in female 02/09/2014    Past Surgical History  Procedure Laterality Date  . Combined kidney-pancreas transplant  2013  . Left fem  1999    rod in left femur  . Ulna surgery  1999    plate put in arm  . Femur surgery  2001    rod removed  . Wrist surgery  2006    right wrist broken  . Elbow surgery  2009  . Catheter surgery for dialysis  2011  . Removal of a dialysis catheter  2012    removed due to infection/tn  . Catheter surgery for dialysis  2012    Current Outpatient Rx  Name  Route  Sig  Dispense  Refill  . baclofen (LIORESAL) 10 MG tablet               . LamoTRIgine (LAMICTAL XR) 100 MG TB24   Oral   Take 300 mg by mouth daily.         Marland Kitchen levonorgestrel (MIRENA) 20 MCG/24HR IUD   Intrauterine   1 Intra Uterine Device (1 each total) by Intrauterine route once. Will place in clinic   1 each   0   . levonorgestrel-ethinyl estradiol (AVIANE,ALESSE,LESSINA) 0.1-20 MG-MCG tablet   Oral   Take 1 tablet by mouth daily.   1 Package   11   . metroNIDAZOLE (FLAGYL) 500 MG  tablet   Oral   Take 1 tablet (500 mg total) by mouth 2 (two) times daily.   14 tablet   0   . minocycline (MINOCIN,DYNACIN) 100 MG capsule   Oral   Take 1 capsule (100 mg total) by mouth 2 (two) times daily.   14 capsule   0   . mycophenolate (MYFORTIC) 180 MG EC tablet   Oral   Take 180 mg by mouth 2 (two) times daily. Takes 4 qam and 4qhs         . omeprazole (PRILOSEC) 20 MG capsule   Oral   Take 20 mg by mouth daily.         . ondansetron (ZOFRAN) 4 MG tablet   Oral   Take 4 mg by mouth every 8 (eight) hours as needed.         . rizatriptan (MAXALT) 10 MG tablet   Oral   Take 10 mg by mouth as needed. May repeat in 2 hours if needed         . tacrolimus (PROGRAF) 1 MG capsule   Oral   Take 1 mg by mouth 2 (  two) times daily.         . tacrolimus (PROGRAF) 5 MG capsule   Oral   Take 5 mg by mouth 2 (two) times daily.           Allergies Clarithromycin; Enoxaparin sodium; Entex t; Levetiracetam; Quinine derivatives; Ciprofloxacin; Keppra; Latex; Naproxen sodium; Nsaids; and Scopolamine  Family History  Problem Relation Age of Onset  . Heart disease Father   . Hypertension Maternal Grandmother   . Hypertension Maternal Grandfather   . Heart disease Paternal Grandmother   . Hypertension Paternal Grandmother   . Hypertension Paternal Grandfather     Social History Social History  Substance Use Topics  . Smoking status: Never Smoker   . Smokeless tobacco: Never Used  . Alcohol Use: No    Review of Systems  Constitutional: Negative for fever. Eyes: Negative for visual changes. ENT: Negative for sore throat. Cardiovascular: Negative for chest pain. Respiratory: Negative for shortness of breath. Gastrointestinal: Negative for abdominal pain, vomiting and diarrhea. Genitourinary: Negative for dysuria. Musculoskeletal: Negative for back pain. Skin: Negative for rash. Neurological: Negative for headaches, focal weakness or  numbness. ____________________________________________  PHYSICAL EXAM:  VITAL SIGNS: ED Triage Vitals  Enc Vitals Group     BP 12/13/14 1728 149/84 mmHg     Pulse Rate 12/13/14 1728 76     Resp 12/13/14 1728 20     Temp 12/13/14 1728 97.7 F (36.5 C)     Temp Source 12/13/14 1728 Oral     SpO2 12/13/14 1728 99 %     Weight 12/13/14 1728 117 lb (53.071 kg)     Height 12/13/14 1728  (1.651 m)     Head Cir --      Peak Flow --      Pain Score 12/13/14 1729 4     Pain Loc --      Pain Edu? --      Excl. in GC? --     Constitutional: Alert and oriented. Well appearing and in no distress. Eyes: Conjunctivae are normal. PERRL. Normal extraocular movements. ENT   Head: Normocephalic and atraumatic.   Nose: No congestion/rhinorrhea.   Mouth/Throat: Mucous membranes are moist.   Neck: Supple. No thyromegaly. Hematological/Lymphatic/Immunological: No cervical lymphadenopathy. Cardiovascular: Normal rate, regular rhythm.  Respiratory: Normal respiratory effort. No wheezes/rales/rhonchi. Gastrointestinal: Soft and nontender. No distention. Musculoskeletal: Nontender with normal range of motion in all extremities.  Neurologic:  Normal gait without ataxia. Normal speech and language. No gross focal neurologic deficits are appreciated. Skin:  Skin is warm, dry and intact. No rash noted. Psychiatric: Mood and affect are normal. Patient exhibits appropriate insight and judgment. ____________________________________________   RADIOLOGY Right Thumb IMPRESSION: No fracture or dislocation. No appreciable arthropathy. Extensive arterial vascular calcification, a finding consistent with diabetes mellitus.  I, Menshew, Charlesetta Ivory, personally viewed and evaluated these images (plain radiographs) as part of my medical decision making.  ____________________________________________  PROCEDURES  Thumb spica with alumofoam   ____________________________________________  INITIAL IMPRESSION / ASSESSMENT AND PLAN / ED COURSE  Right thumb sprain after FOOSH-type injury. No radiologic evidence of acute fracture dislocation. We will treat for thumb sprain with a thumb spica application. Patient is to follow-up with primary care Merie Wulf or Dr. Joice Lofts for ongoing symptoms. ____________________________________________  FINAL CLINICAL IMPRESSION(S) / ED DIAGNOSES  Final diagnoses:  Thumb pain, right  Strain of thumb, right, initial encounter      Lissa Hoard, PA-C 12/13/14 1921  Jennye Moccasin, MD  12/13/14 1936 

## 2015-02-21 ENCOUNTER — Emergency Department
Admission: EM | Admit: 2015-02-21 | Discharge: 2015-02-21 | Discharge status: Against Medical Advice | Payer: Medicare Other | Attending: Emergency Medicine | Admitting: Emergency Medicine

## 2015-02-21 ENCOUNTER — Encounter: Payer: Self-pay | Admitting: Emergency Medicine

## 2015-02-21 ENCOUNTER — Emergency Department: Payer: Medicare Other

## 2015-02-21 DIAGNOSIS — E1165 Type 2 diabetes mellitus with hyperglycemia: Secondary | ICD-10-CM | POA: Diagnosis not present

## 2015-02-21 DIAGNOSIS — N186 End stage renal disease: Secondary | ICD-10-CM | POA: Insufficient documentation

## 2015-02-21 DIAGNOSIS — R42 Dizziness and giddiness: Secondary | ICD-10-CM | POA: Insufficient documentation

## 2015-02-21 DIAGNOSIS — Z940 Kidney transplant status: Secondary | ICD-10-CM | POA: Diagnosis not present

## 2015-02-21 DIAGNOSIS — R739 Hyperglycemia, unspecified: Secondary | ICD-10-CM

## 2015-02-21 DIAGNOSIS — Z91040 Latex allergy status: Secondary | ICD-10-CM | POA: Insufficient documentation

## 2015-02-21 DIAGNOSIS — R109 Unspecified abdominal pain: Secondary | ICD-10-CM | POA: Insufficient documentation

## 2015-02-21 DIAGNOSIS — E871 Hypo-osmolality and hyponatremia: Secondary | ICD-10-CM | POA: Insufficient documentation

## 2015-02-21 LAB — COMPREHENSIVE METABOLIC PANEL
ALT: 8 U/L — ABNORMAL LOW (ref 14–54)
AST: 9 U/L — ABNORMAL LOW (ref 15–41)
Albumin: 3.4 g/dL — ABNORMAL LOW (ref 3.5–5.0)
Alkaline Phosphatase: 121 U/L (ref 38–126)
Anion gap: 16 — ABNORMAL HIGH (ref 5–15)
BUN: 38 mg/dL — ABNORMAL HIGH (ref 6–20)
CHLORIDE: 83 mmol/L — AB (ref 101–111)
CO2: 18 mmol/L — ABNORMAL LOW (ref 22–32)
Calcium: 8.4 mg/dL — ABNORMAL LOW (ref 8.9–10.3)
Creatinine, Ser: 6.16 mg/dL — ABNORMAL HIGH (ref 0.44–1.00)
GFR, EST AFRICAN AMERICAN: 9 mL/min — AB (ref >60)
GFR, EST NON AFRICAN AMERICAN: 8 mL/min — AB (ref >60)
Glucose, Bld: 932 mg/dL (ref 65–99)
POTASSIUM: 5.3 mmol/L — AB (ref 3.5–5.1)
Sodium: 117 mmol/L — CL (ref 135–145)
Total Bilirubin: 0.8 mg/dL (ref 0.3–1.2)
Total Protein: 7.3 g/dL (ref 6.5–8.1)

## 2015-02-21 LAB — LIPASE, BLOOD: LIPASE: 17 U/L (ref 11–51)

## 2015-02-21 LAB — CBC WITH DIFFERENTIAL/PLATELET
Basophils Absolute: 0.1 10*3/uL (ref 0–0.1)
Basophils Relative: 1 %
Eosinophils Absolute: 0.2 10*3/uL (ref 0–0.7)
HCT: 41.6 % (ref 35.0–47.0)
Hemoglobin: 12 g/dL (ref 12.0–16.0)
LYMPHS ABS: 0.5 10*3/uL — AB (ref 1.0–3.6)
MCH: 25.3 pg — AB (ref 26.0–34.0)
MCHC: 29 g/dL — ABNORMAL LOW (ref 32.0–36.0)
MCV: 87.5 fL (ref 80.0–100.0)
MONO ABS: 0.5 10*3/uL (ref 0.2–0.9)
Monocytes Relative: 5 %
Neutro Abs: 7.9 10*3/uL — ABNORMAL HIGH (ref 1.4–6.5)
Neutrophils Relative %: 85 %
PLATELETS: 467 10*3/uL — AB (ref 150–440)
RBC: 4.75 MIL/uL (ref 3.80–5.20)
RDW: 18 % — AB (ref 11.5–14.5)
WBC: 9.2 10*3/uL (ref 3.6–11.0)

## 2015-02-21 LAB — GLUCOSE, CAPILLARY

## 2015-02-21 MED ORDER — MECLIZINE HCL 25 MG PO TABS
50.0000 mg | ORAL_TABLET | Freq: Once | ORAL | Status: AC
  Administered 2015-02-21: 50 mg via ORAL
  Filled 2015-02-21: qty 2

## 2015-02-21 NOTE — ED Notes (Signed)
Vertigo and left side flank pain.

## 2015-02-21 NOTE — ED Notes (Signed)
MD into room to make pt aware of lab results and that she needed to admitted into the hospital, pt refused to stay in hospital, signed AMA form, pt alert and in no distress at time of disposition

## 2015-02-21 NOTE — ED Provider Notes (Signed)
[facility] Emergency Department Provider Note     Time seen: ----------------------------------------- 7:57 AM on 02/21/2015 -----------------------------------------    I have reviewed the triage vital signs and the nursing notes.   HISTORY  Chief Complaint Dizziness and Flank Pain    HPI [name] is a 39 y.o. female who presents the ER for vertigo and left-sided flank pain.Patient states she's had a history of vertigo, in the past they've been associated with migraines. Currently she is not having any headache, she currently also is having a left side pain that she thinks may be from the way she is sleeping. Nothing makes her symptoms better or worse.   Past Medical History  Diagnosis Date  . Allergy   . Anxiety   . Cataract   . Diabetes mellitus without complication (HCC)     history of/had transplant/tn  . Heart murmur   . Chronic kidney disease 04/08/2011    Kidney transplant  . Seizures [facility](HCC)     Patient Active Problem List   Diagnosis Date Noted  . Cervicitis 02/09/2014  . Pelvic pain in female 02/09/2014    Past Surgical History  Procedure Laterality Date  . Combined kidney-pancreas transplant  2013  . Left fem  1999    rod in left femur  . Ulna surgery  1999    plate put in arm  . Femur surgery  2001    rod removed  . Wrist surgery  2006    right wrist broken  . Elbow surgery  2009  . Catheter surgery for dialysis  2011  . Removal of a dialysis catheter  2012    removed due to infection/tn  . Catheter surgery for dialysis  2012    Allergies Clarithromycin; Enoxaparin sodium; Entex t; Levetiracetam; Quinine derivatives; Ciprofloxacin; Keppra; Latex; Naproxen sodium; Nsaids; and Scopolamine  Social History Social History  Substance Use Topics  . Smoking status: Never Smoker   . Smokeless tobacco: Never Used  . Alcohol Use: No    Review of Systems Constitutional: Negative for fever. Eyes: Negative for  visual changes. ENT: Negative for sore throat. Cardiovascular: Negative for chest pain. Respiratory: Negative for shortness of breath. Gastrointestinal: Positive for left flank pain, negative for vomiting and diarrhea Genitourinary: Negative for dysuria. Musculoskeletal: Negative for back pain. Skin: Negative for rash. Neurological: Negative for headaches, focal weakness or numbness. Positive for vertigo  10-point ROS otherwise negative.  ____________________________________________   PHYSICAL EXAM:  VITAL SIGNS: ED Triage Vitals  Enc Vitals Group     BP 02/21/15 0751 169/77 mmHg     Pulse Rate 02/21/15 0751 80     Resp 02/21/15 0751 16     Temp 02/21/15 0751 97.5 F (36.4 C)     Temp Source 02/21/15 0751 Oral     SpO2 02/21/15 0751 92 %     Weight 02/21/15 0751 115 lb (52.164 kg)     Height 02/21/15 0751 5\' 5"  (1.651 m)     Head Cir --      Peak Flow --      Pain Score 02/21/15 0752 5     Pain Loc --      Pain Edu? --      Excl. in GC? --     Constitutional: Alert and oriented. Well appearing and in no distress. Eyes: Conjunctivae are normal. PERRL. Normal extraocular movements. ENT   Head: Normocephalic and atraumatic.   Nose: No congestion/rhinnorhea.   Mouth/Throat: Mucous membranes are moist.  Neck: No stridor. Cardiovascular: Normal rate, regular rhythm. Normal and symmetric distal pulses are present in all extremities. No murmurs, rubs, or gallops. Left inferior axillary rib tenderness. Respiratory: Normal respiratory effort without tachypnea nor retractions. Breath sounds are clear and equal bilaterally. No wheezes/rales/rhonchi. Gastrointestinal: Soft and nontender. No distention. No abdominal bruits.  Musculoskeletal: Nontender with normal range of motion in all extremities. No joint effusions.  No lower extremity tenderness, bilateral pitting edema. Neurologic:  Normal speech and language. No gross focal neurologic deficits are appreciated.  Speech is normal. No gait instability. Skin:  Skin is warm, dry and intact. No rash noted. Psychiatric: Mood and affect are normal. Speech and behavior are normal. Patient exhibits appropriate insight and judgment. ____________________________________________  ED COURSE:  Pertinent labs & imaging results that were available during my care of the patient were reviewed by me and considered in my medical decision making (see chart for details). Patient's no acute distress, is chronically ill. ____________________________________________    LABS (pertinent positives/negatives)  Labs Reviewed  CBC WITH DIFFERENTIAL/PLATELET - Abnormal; Notable for the following:    MCH 25.3 (*)    MCHC 29.0 (*)    RDW 18.0 (*)    Platelets 467 (*)    Neutro Abs 7.9 (*)    Lymphs Abs 0.5 (*)    All other components within normal limits  COMPREHENSIVE METABOLIC PANEL - Abnormal; Notable for the following:    Sodium 117 (*)    Potassium 5.3 (*)    Chloride 83 (*)    CO2 18 (*)    Glucose, Bld 932 (*)    BUN 38 (*)    Creatinine, Ser 6.16 (*)    Calcium 8.4 (*)    Albumin 3.4 (*)    AST 9 (*)    ALT 8 (*)    GFR calc non Af Amer 8 (*)    GFR calc Af Amer 9 (*)    Anion gap 16 (*)    All other components within normal limits  GLUCOSE, CAPILLARY - Abnormal; Notable for the following:    Glucose-Capillary >600 (*)    All other components within normal limits  GLUCOSE, CAPILLARY - Abnormal; Notable for the following:    Glucose-Capillary >600 (*)    All other components within normal limits  LIPASE, BLOOD    RADIOLOGY Images were viewed by me  Abdomen 2 view IMPRESSION: No obstruction or free air. Moderate stool throughout colon. Intrauterine device present. Extensive arterial vascular calcification. Bones osteoporotic. Catheter with tip overlying junction of right atrium and inferior vena cava.  ____________________________________________  FINAL ASSESSMENT AND PLAN  Vertigo,  abdominal pain, chronic kidney disease, hyperglycemia  Plan: Patient with labs and imaging as dictated above. We have reverified her hyperglycemia. I've advised starting her on insulin drip, giving her saline and transferring [facility] to [facility] where she her transplant doctors are. Patient has declined, states she will not stay in the hospital under any circumstances. She will not stay in any hospital, I have advised if her blood sugars continue to worsen she will likely die. Patient expressed her understanding, is leaving and signing out AGAINST MEDICAL ADVICE inexplicably. I have repeatedly tried to talk her into receiving treatment.   Emily Filbert, MD   Emily Filbert, MD 02/21/15 [phone]

## 2015-02-21 NOTE — Discharge Instructions (Signed)
Acknowledgement of Risk of Discharge    Against Medical Advice     And Release of Liability        Because I am choosing to leave the hospital in spite of these risks, I release the hospital, its employees and officers, and my attending physician from all liability for any adverse results caused by my leaving the hospital prematurely.      ________________________________      _____________________________________  Patient's Signature                                        Date and Time      ________________________________      _____________________________________  Witness' Signature                                        Relationship to Patient        ________________________________      _____________________________________  Witness' Signature                                        Relationship to Patient      [If patient refuses to sign, write "Patient refused to sign" on the patient's signature line and have witnesses to the refusal sign as witnesses.]

## 2015-09-27 ENCOUNTER — Ambulatory Visit: Payer: Medicare Other | Admitting: Obstetrics & Gynecology

## 2015-10-19 ENCOUNTER — Ambulatory Visit: Payer: Medicare Other | Admitting: Obstetrics & Gynecology

## 2016-01-18 ENCOUNTER — Observation Stay
Admission: EM | Admit: 2016-01-18 | Discharge: 2016-01-19 | Disposition: A | Discharge status: Home / Self Care | Payer: Medicare Other | Attending: Internal Medicine | Admitting: Internal Medicine

## 2016-01-18 ENCOUNTER — Encounter: Payer: Self-pay | Admitting: Emergency Medicine

## 2016-01-18 DIAGNOSIS — Z794 Long term (current) use of insulin: Secondary | ICD-10-CM | POA: Insufficient documentation

## 2016-01-18 DIAGNOSIS — Z992 Dependence on renal dialysis: Secondary | ICD-10-CM | POA: Diagnosis not present

## 2016-01-18 DIAGNOSIS — D735 Infarction of spleen: Secondary | ICD-10-CM | POA: Insufficient documentation

## 2016-01-18 DIAGNOSIS — T86891 Other transplanted tissue failure: Secondary | ICD-10-CM | POA: Insufficient documentation

## 2016-01-18 DIAGNOSIS — R569 Unspecified convulsions: Secondary | ICD-10-CM | POA: Diagnosis not present

## 2016-01-18 DIAGNOSIS — R110 Nausea: Secondary | ICD-10-CM | POA: Diagnosis present

## 2016-01-18 DIAGNOSIS — Z888 Allergy status to other drugs, medicaments and biological substances status: Secondary | ICD-10-CM | POA: Diagnosis not present

## 2016-01-18 DIAGNOSIS — Z9641 Presence of insulin pump (external) (internal): Secondary | ICD-10-CM | POA: Diagnosis not present

## 2016-01-18 DIAGNOSIS — K219 Gastro-esophageal reflux disease without esophagitis: Secondary | ICD-10-CM | POA: Diagnosis not present

## 2016-01-18 DIAGNOSIS — N2581 Secondary hyperparathyroidism of renal origin: Secondary | ICD-10-CM | POA: Insufficient documentation

## 2016-01-18 DIAGNOSIS — E1036 Type 1 diabetes mellitus with diabetic cataract: Secondary | ICD-10-CM | POA: Insufficient documentation

## 2016-01-18 DIAGNOSIS — Z9889 Other specified postprocedural states: Secondary | ICD-10-CM | POA: Insufficient documentation

## 2016-01-18 DIAGNOSIS — N186 End stage renal disease: Secondary | ICD-10-CM | POA: Diagnosis not present

## 2016-01-18 DIAGNOSIS — E875 Hyperkalemia: Secondary | ICD-10-CM | POA: Diagnosis not present

## 2016-01-18 DIAGNOSIS — Z79899 Other long term (current) drug therapy: Secondary | ICD-10-CM | POA: Diagnosis not present

## 2016-01-18 DIAGNOSIS — I120 Hypertensive chronic kidney disease with stage 5 chronic kidney disease or end stage renal disease: Secondary | ICD-10-CM | POA: Insufficient documentation

## 2016-01-18 DIAGNOSIS — Z91040 Latex allergy status: Secondary | ICD-10-CM | POA: Insufficient documentation

## 2016-01-18 DIAGNOSIS — Z8249 Family history of ischemic heart disease and other diseases of the circulatory system: Secondary | ICD-10-CM | POA: Insufficient documentation

## 2016-01-18 DIAGNOSIS — Z886 Allergy status to analgesic agent status: Secondary | ICD-10-CM | POA: Insufficient documentation

## 2016-01-18 DIAGNOSIS — E1022 Type 1 diabetes mellitus with diabetic chronic kidney disease: Secondary | ICD-10-CM | POA: Diagnosis not present

## 2016-01-18 DIAGNOSIS — D631 Anemia in chronic kidney disease: Secondary | ICD-10-CM | POA: Diagnosis not present

## 2016-01-18 DIAGNOSIS — E119 Type 2 diabetes mellitus without complications: Secondary | ICD-10-CM

## 2016-01-18 DIAGNOSIS — Z975 Presence of (intrauterine) contraceptive device: Secondary | ICD-10-CM | POA: Diagnosis not present

## 2016-01-18 DIAGNOSIS — T8612 Kidney transplant failure: Secondary | ICD-10-CM | POA: Diagnosis not present

## 2016-01-18 DIAGNOSIS — F419 Anxiety disorder, unspecified: Secondary | ICD-10-CM | POA: Insufficient documentation

## 2016-01-18 DIAGNOSIS — Z881 Allergy status to other antibiotic agents status: Secondary | ICD-10-CM | POA: Diagnosis not present

## 2016-01-18 HISTORY — DX: End stage renal disease: N18.6

## 2016-01-18 HISTORY — DX: Dependence on renal dialysis: Z99.2

## 2016-01-18 LAB — COMPREHENSIVE METABOLIC PANEL
ALBUMIN: 3.8 g/dL (ref 3.5–5.0)
ALT: 10 U/L — ABNORMAL LOW (ref 14–54)
AST: 14 U/L — AB (ref 15–41)
Alkaline Phosphatase: 109 U/L (ref 38–126)
Anion gap: 20 — ABNORMAL HIGH (ref 5–15)
BUN: 71 mg/dL — AB (ref 6–20)
CHLORIDE: 94 mmol/L — AB (ref 101–111)
CO2: 17 mmol/L — ABNORMAL LOW (ref 22–32)
Calcium: 8.5 mg/dL — ABNORMAL LOW (ref 8.9–10.3)
Creatinine, Ser: 11.23 mg/dL — ABNORMAL HIGH (ref 0.44–1.00)
GFR calc Af Amer: 4 mL/min — ABNORMAL LOW (ref >60)
GFR calc non Af Amer: 4 mL/min — ABNORMAL LOW (ref >60)
GLUCOSE: 235 mg/dL — AB (ref 65–99)
POTASSIUM: 7 mmol/L — AB (ref 3.5–5.1)
SODIUM: 131 mmol/L — AB (ref 135–145)
Total Bilirubin: 0.8 mg/dL (ref 0.3–1.2)
Total Protein: 8.4 g/dL — ABNORMAL HIGH (ref 6.5–8.1)

## 2016-01-18 LAB — GLUCOSE, CAPILLARY: GLUCOSE-CAPILLARY: 184 mg/dL — AB (ref 65–99)

## 2016-01-18 LAB — CBC
HEMATOCRIT: 28.9 % — AB (ref 35.0–47.0)
Hemoglobin: 9.1 g/dL — ABNORMAL LOW (ref 12.0–16.0)
MCH: 22.5 pg — ABNORMAL LOW (ref 26.0–34.0)
MCHC: 31.5 g/dL — ABNORMAL LOW (ref 32.0–36.0)
MCV: 71.3 fL — ABNORMAL LOW (ref 80.0–100.0)
PLATELETS: 391 10*3/uL (ref 150–440)
RBC: 4.05 MIL/uL (ref 3.80–5.20)
RDW: 18.3 % — AB (ref 11.5–14.5)
WBC: 9.2 10*3/uL (ref 3.6–11.0)

## 2016-01-18 MED ORDER — INSULIN ASPART 100 UNIT/ML ~~LOC~~ SOLN
6.0000 [IU] | Freq: Once | SUBCUTANEOUS | Status: DC
  Filled 2016-01-18: qty 6

## 2016-01-18 MED ORDER — MECLIZINE HCL 25 MG PO TABS
ORAL_TABLET | ORAL | Status: AC
  Filled 2016-01-18: qty 1

## 2016-01-18 MED ORDER — ALBUTEROL SULFATE (2.5 MG/3ML) 0.083% IN NEBU
INHALATION_SOLUTION | RESPIRATORY_TRACT | Status: AC
  Filled 2016-01-18: qty 3

## 2016-01-18 MED ORDER — PROCHLORPERAZINE EDISYLATE 5 MG/ML IJ SOLN
5.0000 mg | Freq: Once | INTRAMUSCULAR | Status: AC
  Administered 2016-01-18: 5 mg via INTRAVENOUS
  Filled 2016-01-18: qty 2

## 2016-01-18 MED ORDER — ALBUTEROL SULFATE (2.5 MG/3ML) 0.083% IN NEBU
2.5000 mg | INHALATION_SOLUTION | Freq: Once | RESPIRATORY_TRACT | Status: AC
  Administered 2016-01-18: 2.5 mg via RESPIRATORY_TRACT
  Filled 2016-01-18: qty 3

## 2016-01-18 MED ORDER — ONDANSETRON HCL 4 MG/2ML IJ SOLN
4.0000 mg | Freq: Once | INTRAMUSCULAR | Status: DC
  Filled 2016-01-18: qty 2

## 2016-01-18 MED ORDER — MORPHINE SULFATE (PF) 2 MG/ML IV SOLN
4.0000 mg | Freq: Once | INTRAVENOUS | Status: DC
  Filled 2016-01-18: qty 2

## 2016-01-18 MED ORDER — MECLIZINE HCL 25 MG PO TABS
12.5000 mg | ORAL_TABLET | Freq: Once | ORAL | Status: AC
  Administered 2016-01-18: 12.5 mg via ORAL
  Filled 2016-01-18: qty 1

## 2016-01-18 MED ORDER — INSULIN ASPART 100 UNIT/ML ~~LOC~~ SOLN
6.0000 [IU] | Freq: Once | SUBCUTANEOUS | Status: AC
  Administered 2016-01-18: 6 [IU] via INTRAVENOUS
  Filled 2016-01-18: qty 6

## 2016-01-18 MED ORDER — SODIUM CHLORIDE 0.9 % IV SOLN
1.0000 g | Freq: Once | INTRAVENOUS | Status: AC
  Administered 2016-01-18: 1 g via INTRAVENOUS
  Filled 2016-01-18: qty 10

## 2016-01-18 MED ORDER — DEXTROSE 50 % IV SOLN
25.0000 mL | Freq: Once | INTRAVENOUS | Status: AC
  Administered 2016-01-18: 25 mL via INTRAVENOUS
  Filled 2016-01-18: qty 50

## 2016-01-18 NOTE — ED Notes (Signed)
Called pharmacy to request calcium gluconate. Pharmacist reported he would send medication

## 2016-01-18 NOTE — ED Notes (Signed)
Lab called critical k 7.0 , MD notified

## 2016-01-18 NOTE — ED Notes (Signed)
Pt c/o nausea and dizziness since Tuesday. Pt denies any CP or SOB.

## 2016-01-18 NOTE — ED Notes (Signed)
Pharm called about calcium gluc, will send after mixing

## 2016-01-18 NOTE — ED Notes (Signed)
sats 89%. Pt placed on 2L [place] o2,

## 2016-01-18 NOTE — ED Triage Notes (Signed)
Pt in with co nausea since Tuesday, co dizziness. Denies any abd pain or diarrhea at this time. Pt is dialysis patient and has not had it since Monday.

## 2016-01-18 NOTE — Progress Notes (Signed)
Discussed case with Dr Derrill KayGoodman. Patient missed her dialysis treament. Potassium is 7.  - Admit to Stepdown or ICU for monitoring - Shifting measures for high potassium  - Emergent HD - Dialysis nurse has been contacted and orders placed

## 2016-01-18 NOTE — H&P (Signed)
[facility] [place] [facility]   PATIENT NAME: Debra CharonJamie Stein    MR#:  [id number]  DATE OF BIRTH:  12-17-1975  DATE OF ADMISSION:  01/18/2016  PRIMARY CARE PHYSICIAN: Elizabeth PalauANDERSON,TERESA, FNP   REQUESTING/REFERRING PHYSICIAN: Derrill KayGoodman, MD  CHIEF COMPLAINT:   Chief Complaint  Patient presents with  . Nausea    HISTORY OF PRESENT ILLNESS:  [name]  is a 40 y.o. female who presents with Nausea and dizziness. Patient states that she has missed her last 2 dialysis sessions. In the ED today she was found to have a potassium of 7 with peaked T waves on EKG. Nephrology was consulted for urgent dialysis, and hospitalists were called for admission.  PAST MEDICAL HISTORY:   Past Medical History:  Diagnosis Date  . Allergy   . Anxiety   . Cataract   . Diabetes mellitus without complication (HCC)    history of/had transplant/tn  . ESRD on dialysis [facility](HCC) 04/08/2011   Kidney transplant  . Heart murmur   . Seizures (HCC)     PAST SURGICAL HISTORY:   Past Surgical History:  Procedure Laterality Date  . catheter surgery for dialysis  2011  . catheter surgery for dialysis  2012  . COMBINED KIDNEY-PANCREAS TRANSPLANT  2013  . ELBOW SURGERY  2009  . FEMUR SURGERY  2001   rod removed  . left fem  1999   rod in left femur  . REMOVAL OF A DIALYSIS CATHETER  2012   removed due to infection/tn  . ulna surgery  1999   plate put in arm  . WRIST SURGERY  2006   right wrist broken    SOCIAL HISTORY:   Social History  Substance Use Topics  . Smoking status: Never Smoker  . Smokeless tobacco: Never Used  . Alcohol use No    FAMILY HISTORY:   Family History  Problem Relation Age of Onset  . Heart disease Father   . Hypertension Maternal Grandmother   . Hypertension Maternal Grandfather   . Heart disease Paternal Grandmother   . Hypertension Paternal Grandmother   . Hypertension Paternal Grandfather     DRUG ALLERGIES:   Allergies  Allergen  Reactions  . Clarithromycin Rash    Can not take due to transplant  . Enoxaparin Sodium Rash  . Entex T [Pseudoephedrine-Guaifenesin] Rash  . Levetiracetam Other (See Comments)    Constant Migraine  . Quinine Derivatives Rash    vomitting  . Ciprofloxacin Other (See Comments)    tingling  . Keppra [Levetiracetam] Other (See Comments)    Headache  . Latex     ONLY CONDOMS  . Naproxen Sodium Other (See Comments)    Ate lining of stomach/ Can not take due to transplant/tn  . Nsaids     Can not take due to kidney transplant/tn  . Scopolamine Rash    MEDICATIONS AT HOME:   Prior to Admission medications   Medication Sig Start Date End Date Taking? Authorizing Provider  amLODipine (NORVASC) 10 MG tablet Take 10 mg by mouth daily.   Yes Historical Provider, MD  AURYXIA 1 GM 210 MG(Fe) TABS Take 420 mg by mouth 2 (two) times daily before a meal.   Yes Historical Provider, MD  labetalol (NORMODYNE) 200 MG tablet Take 1 tablet by mouth 3 (three) times daily. 01/29/15  Yes Historical Provider, MD  LamoTRIgine (LAMICTAL XR) 100 MG TB24 Take 300 mg by mouth daily.   Yes Historical Provider, MD  levonorgestrel (MIRENA)  20 MCG/24HR IUD 1 Intra Uterine Device (1 each total) by Intrauterine route once. Will place in clinic 03/03/12  Yes Reva Bores, MD  NOVOLOG 100 UNIT/ML injection Inject 0-30 Units into the skin daily. Via insulin pump   Yes Historical Provider, MD  omeprazole (PRILOSEC) 20 MG capsule Take 20 mg by mouth daily.   Yes Historical Provider, MD  ondansetron (ZOFRAN) 4 MG tablet Take 4 mg by mouth every 8 (eight) hours as needed.   Yes Historical Provider, MD  rizatriptan (MAXALT) 10 MG tablet Take 10 mg by mouth as needed. May repeat in 2 hours if needed   Yes Historical Provider, MD  tacrolimus (PROGRAF) 1 MG capsule Take 3 mg by mouth 2 (two) times daily.    Yes Historical Provider, MD  levonorgestrel-ethinyl estradiol (AVIANE,ALESSE,LESSINA) 0.1-20 MG-MCG tablet Take 1 tablet  by mouth daily. 08/14/12   Allie Bossier, MD  metroNIDAZOLE (FLAGYL) 500 MG tablet Take 1 tablet (500 mg total) by mouth 2 (two) times daily. 02/09/14   Bertram Denver, PA-C  minocycline (MINOCIN,DYNACIN) 100 MG capsule Take 1 capsule (100 mg total) by mouth 2 (two) times daily. 02/09/14   Bertram Denver, PA-C    REVIEW OF SYSTEMS:  Review of Systems  Constitutional: Negative for chills, fever, malaise/fatigue and weight loss.  HENT: Negative for ear pain, hearing loss and tinnitus.   Eyes: Negative for blurred vision, double vision, pain and redness.  Respiratory: Negative for cough, hemoptysis and shortness of breath.   Cardiovascular: Negative for chest pain, palpitations, orthopnea and leg swelling.  Gastrointestinal: Positive for nausea. Negative for abdominal pain, constipation, diarrhea and vomiting.  Genitourinary: Negative for dysuria, frequency and hematuria.  Musculoskeletal: Negative for back pain, joint pain and neck pain.  Skin:       No acne, rash, or lesions  Neurological: Positive for dizziness. Negative for tremors, focal weakness and weakness.  Endo/Heme/Allergies: Negative for polydipsia. Does not bruise/bleed easily.  Psychiatric/Behavioral: Negative for depression. The patient is not nervous/anxious and does not have insomnia.      VITAL SIGNS:   Vitals:   01/18/16 1944 01/18/16 2110  BP: (!) 141/70 131/74  Pulse: 80 74  Resp: 18   Temp: 98.1 F (36.7 C)   TempSrc: Oral   SpO2: 96% 93%  Weight: 55.3 kg (122 lb)   Height: 5\' 5"  (1.651 m)    Wt Readings from Last 3 Encounters:  01/18/16 55.3 kg (122 lb)  02/21/15 52.2 kg (115 lb)  12/13/14 53.1 kg (117 lb)    PHYSICAL EXAMINATION:  Physical Exam  Vitals reviewed. Constitutional: She is oriented to person, place, and time. She appears well-developed and well-nourished. No distress.  HENT:  Head: Normocephalic and atraumatic.  Mouth/Throat: Oropharynx is clear and moist.  Eyes: Conjunctivae  and EOM are normal. Pupils are equal, round, and reactive to light. No scleral icterus.  Neck: Normal range of motion. Neck supple. No JVD present. No thyromegaly present.  Cardiovascular: Intact distal pulses.  Exam reveals no gallop and no friction rub.   No murmur heard. Transient episodes of tachycardia and brief V. tach, otherwise regular rhythm and a normal rate  Respiratory: Effort normal and breath sounds normal. No respiratory distress. She has no wheezes. She has no rales.  GI: Soft. Bowel sounds are normal. She exhibits no distension. There is no tenderness.  Musculoskeletal: Normal range of motion. She exhibits no edema.  No arthritis, no gout  Lymphadenopathy:    She  has no cervical adenopathy.  Neurological: She is alert and oriented to person, place, and time. No cranial nerve deficit.  No dysarthria, no aphasia  Skin: Skin is warm and dry. No rash noted. No erythema.  Psychiatric: She has a normal mood and affect. Her behavior is normal. Judgment and thought content normal.    LABORATORY PANEL:   CBC  Recent Labs Lab 01/18/16 1946  WBC 9.2  HGB 9.1*  HCT 28.9*  PLT 391   ------------------------------------------------------------------------------------------------------------------  Chemistries   Recent Labs Lab 01/18/16 1946  NA 131*  K 7.0*  CL 94*  CO2 17*  GLUCOSE 235*  BUN 71*  CREATININE 11.23*  CALCIUM 8.5*  AST 14*  ALT 10*  ALKPHOS 109  BILITOT 0.8   ------------------------------------------------------------------------------------------------------------------  Cardiac Enzymes No results for input(s): TROPONINI in the last 168 hours. ------------------------------------------------------------------------------------------------------------------  RADIOLOGY:  No results found.  EKG:   Orders placed or performed during the hospital encounter of 01/18/16  . EKG 12-Lead  . EKG 12-Lead    IMPRESSION AND PLAN:  Principal  Problem:   Hyperkalemia - calcium, insulin and dextrose, albuterol given in the ED, nephrology called for urgent dialysis, this is being arranged Active Problems:   ESRD on dialysis [facility](HCC) - urgent dialysis as above, nephrology consult as above   Diabetes (HCC) - renal/carb modified diet, continue insulin pump with order set   Seizures (HCC) - seizure precautions, continue home antiepileptics   Anxiety - continue home meds  All the records are reviewed and case discussed with ED provider. Management plans discussed with the patient and/or family.  DVT PROPHYLAXIS: SubQ heparin  GI PROPHYLAXIS: None  ADMISSION STATUS: Observation  CODE STATUS: Full Code Status History    This patient does not have a recorded code status. Please follow your organizational policy for patients in this situation.      TOTAL TIME TAKING CARE OF THIS PATIENT: 40 minutes.    Atanacio Melnyk FIELDING 01/18/2016, 10:31 PM  [facility]  Office  [phone]  CC: Primary care physician; Elizabeth PalauANDERSON,TERESA, FNP

## 2016-01-18 NOTE — ED Provider Notes (Signed)
[facility] Emergency Department Provider Note   ____________________________________________   I have reviewed the triage vital signs and the nursing notes.   HISTORY  Chief Complaint Nausea   History limited by: Not Limited   HPI [name] is a 40 y.o. female with history of ESRD on dialysis who presents to the emergency department today because of concern for nausea and dizziness. The patient states that the symptoms started 3 days ago. The patient normally recieves dialysis Monday, Tuesday, Thursday and Saturday. She underwent her normal dialysis on Monday however given the illness has not gotten her dialysis Tuesday or today. The patient denies any significant abdominal pain. No fevers. No chest pain, shortness of breath or palpitations.   Past Medical History:  Diagnosis Date  . Allergy   . Anxiety   . Cataract   . Chronic kidney disease 04/08/2011   Kidney transplant  . Diabetes mellitus without complication (HCC)    history of/had transplant/tn  . Heart murmur   . Seizures [facility](HCC)     Patient Active Problem List   Diagnosis Date Noted  . Cervicitis 02/09/2014  . Pelvic pain in female 02/09/2014    Past Surgical History:  Procedure Laterality Date  . catheter surgery for dialysis  2011  . catheter surgery for dialysis  2012  . COMBINED KIDNEY-PANCREAS TRANSPLANT  2013  . ELBOW SURGERY  2009  . FEMUR SURGERY  2001   rod removed  . left fem  1999   rod in left femur  . REMOVAL OF A DIALYSIS CATHETER  2012   removed due to infection/tn  . ulna surgery  1999   plate put in arm  . WRIST SURGERY  2006   right wrist broken    Prior to Admission medications   Medication Sig Start Date End Date Taking? Authorizing Provider  amLODipine (NORVASC) 5 MG tablet Take 1 tablet by mouth daily. 12/15/14   Historical Provider, MD  labetalol (NORMODYNE) 200 MG tablet Take 1 tablet by mouth 3 (three) times daily. 01/29/15   Historical  Provider, MD  LamoTRIgine (LAMICTAL XR) 100 MG TB24 Take 300 mg by mouth daily.    Historical Provider, MD  levonorgestrel (MIRENA) 20 MCG/24HR IUD 1 Intra Uterine Device (1 each total) by Intrauterine route once. Will place in clinic 03/03/12   Reva Boresanya S Pratt, MD  levonorgestrel-ethinyl estradiol (AVIANE,ALESSE,LESSINA) 0.1-20 MG-MCG tablet Take 1 tablet by mouth daily. 08/14/12   Allie BossierMyra C Dove, MD  metroNIDAZOLE (FLAGYL) 500 MG tablet Take 1 tablet (500 mg total) by mouth 2 (two) times daily. 02/09/14   Bertram DenverKaren E Teague Clark, PA-C  minocycline (MINOCIN,DYNACIN) 100 MG capsule Take 1 capsule (100 mg total) by mouth 2 (two) times daily. 02/09/14   Bertram DenverKaren E Teague Clark, PA-C  mycophenolate (MYFORTIC) 180 MG EC tablet Take 180 mg by mouth 2 (two) times daily. Takes 4 qam and 4qhs    Historical Provider, MD  omeprazole (PRILOSEC) 20 MG capsule Take 20 mg by mouth daily.    Historical Provider, MD  ondansetron (ZOFRAN) 4 MG tablet Take 4 mg by mouth every 8 (eight) hours as needed.    Historical Provider, MD  rizatriptan (MAXALT) 10 MG tablet Take 10 mg by mouth as needed. May repeat in 2 hours if needed    Historical Provider, MD  sulfamethoxazole-trimethoprim (BACTRIM,SEPTRA) 400-80 MG tablet Take 1 tablet by mouth every Monday, Wednesday, and Friday.  02/06/15   Historical Provider, MD  tacrolimus (PROGRAF) 1 MG capsule  Take 2 mg by mouth 2 (two) times daily.     Historical Provider, MD  valGANciclovir (VALCYTE) 450 MG tablet Take 1 tablet by mouth every Monday, Wednesday, and Friday.  02/08/15   Historical Provider, MD    Allergies Clarithromycin; Enoxaparin sodium; Entex t [pseudoephedrine-guaifenesin]; Levetiracetam; Quinine derivatives; Ciprofloxacin; Keppra [levetiracetam]; Latex; Naproxen sodium; Nsaids; and Scopolamine  Family History  Problem Relation Age of Onset  . Heart disease Father   . Hypertension Maternal Grandmother   . Hypertension Maternal Grandfather   . Heart disease Paternal  Grandmother   . Hypertension Paternal Grandmother   . Hypertension Paternal Grandfather     Social History Social History  Substance Use Topics  . Smoking status: Never Smoker  . Smokeless tobacco: Never Used  . Alcohol use No    Review of Systems  Constitutional: Negative for fever. Cardiovascular: Negative for chest pain. Respiratory: Negative for shortness of breath. Gastrointestinal: Negative for abdominal pain. Positive for nausea.  Neurological: Negative for headaches, focal weakness or numbness.  10-point ROS otherwise negative.  ____________________________________________   PHYSICAL EXAM:  VITAL SIGNS: ED Triage Vitals  Enc Vitals Group     BP 01/18/16 1944 (!) 141/70     Pulse Rate 01/18/16 1944 80     Resp 01/18/16 1944 18     Temp 01/18/16 1944 98.1 F (36.7 C)     Temp Source 01/18/16 1944 Oral     SpO2 01/18/16 1944 96 %     Weight 01/18/16 1944 122 lb (55.3 kg)     Height 01/18/16 1944 5\' 5"  (1.651 m)     Head Circumference --      Peak Flow --      Pain Score 01/18/16 2109 0   Constitutional: Alert and oriented. Well appearing and in no distress. Eyes: Conjunctivae are normal. Normal extraocular movements. ENT   Head: Normocephalic and atraumatic.   Nose: No congestion/rhinnorhea.   Mouth/Throat: Mucous membranes are moist.   Neck: No stridor. Hematological/Lymphatic/Immunilogical: No cervical lymphadenopathy. Cardiovascular: Normal rate, regular rhythm.  No murmurs, rubs, or gallops. Respiratory: Normal respiratory effort without tachypnea nor retractions. Breath sounds are clear and equal bilaterally. No wheezes/rales/rhonchi. Gastrointestinal: Soft and nontender. No distention.  Genitourinary: Deferred Musculoskeletal: Normal range of motion in all extremities. No lower extremity edema. Neurologic:  Normal speech and language. No gross focal neurologic deficits are appreciated.  Skin:  Skin is warm, dry and intact. No rash  noted. Psychiatric: Mood and affect are normal. Speech and behavior are normal. Patient exhibits appropriate insight and judgment.  ____________________________________________    LABS (pertinent positives/negatives)  Labs Reviewed  CBC - Abnormal; Notable for the following:       Result Value   Hemoglobin 9.1 (*)    HCT 28.9 (*)    MCV 71.3 (*)    MCH 22.5 (*)    MCHC 31.5 (*)    RDW 18.3 (*)    All other components within normal limits  COMPREHENSIVE METABOLIC PANEL - Abnormal; Notable for the following:    Sodium 131 (*)    Potassium 7.0 (*)    Chloride 94 (*)    CO2 17 (*)    Glucose, Bld 235 (*)    BUN 71 (*)    Creatinine, Ser 11.23 (*)    Calcium 8.5 (*)    Total Protein 8.4 (*)    AST 14 (*)    ALT 10 (*)    GFR calc non Af Amer 4 (*)  GFR calc Af Amer 4 (*)    Anion gap 20 (*)    All other components within normal limits     ____________________________________________   EKG  I, Phineas SemenGraydon Denney Shein, attending physician, personally viewed and interpreted this EKG  EKG Time: 2137 Rate: 75 Rhythm: normal sinus rhythm Axis: left axis deviation Intervals: qtc 496 QRS: narrow ST changes: no st elevation, peaked t wave inversion Impression: abnormal ekg   ____________________________________________    RADIOLOGY  None  ____________________________________________   PROCEDURES  Procedures  ____________________________________________   INITIAL IMPRESSION / ASSESSMENT AND PLAN / ED COURSE  Pertinent labs & imaging results that were available during my care of the patient were reviewed by me and considered in my medical decision making (see chart for details).  Patient presented with nausea and dizziness. Initially it sounds as if patient might have had a viral gastroenteritis. However she has not been to her two latest dialysis sessions. Her potassium today was elevated to 7 and her EKG did have diffuse peaked t - waves. Discussed with Dr.  Thedore MinsSingh with nephrology who will help arrange dialysis for the patient tonight. Will admit to the hospitalist service.    ____________________________________________   FINAL CLINICAL IMPRESSION(S) / ED DIAGNOSES  Final diagnoses:  Nausea  Hyperkalemia     Note: This dictation was prepared with Dragon dictation. Any transcriptional errors that result from this process are unintentional    Phineas SemenGraydon Ayad Nieman, MD 01/18/16 2324

## 2016-01-19 DIAGNOSIS — E875 Hyperkalemia: Secondary | ICD-10-CM | POA: Diagnosis not present

## 2016-01-19 LAB — CBC
HCT: 28 % — ABNORMAL LOW (ref 35.0–47.0)
Hemoglobin: 9.1 g/dL — ABNORMAL LOW (ref 12.0–16.0)
MCH: 23 pg — ABNORMAL LOW (ref 26.0–34.0)
MCHC: 32.6 g/dL (ref 32.0–36.0)
MCV: 70.8 fL — ABNORMAL LOW (ref 80.0–100.0)
PLATELETS: 395 10*3/uL (ref 150–440)
RBC: 3.96 MIL/uL (ref 3.80–5.20)
RDW: 18.6 % — AB (ref 11.5–14.5)
WBC: 5.9 10*3/uL (ref 3.6–11.0)

## 2016-01-19 LAB — BASIC METABOLIC PANEL
ANION GAP: 14 (ref 5–15)
BUN: 32 mg/dL — ABNORMAL HIGH (ref 6–20)
CALCIUM: 8.1 mg/dL — AB (ref 8.9–10.3)
CO2: 26 mmol/L (ref 22–32)
CREATININE: 5.44 mg/dL — AB (ref 0.44–1.00)
Chloride: 97 mmol/L — ABNORMAL LOW (ref 101–111)
GFR, EST AFRICAN AMERICAN: 10 mL/min — AB (ref >60)
GFR, EST NON AFRICAN AMERICAN: 9 mL/min — AB (ref >60)
Glucose, Bld: 130 mg/dL — ABNORMAL HIGH (ref 65–99)
Potassium: 3.3 mmol/L — ABNORMAL LOW (ref 3.5–5.1)
Sodium: 137 mmol/L (ref 135–145)

## 2016-01-19 LAB — GLUCOSE, CAPILLARY
GLUCOSE-CAPILLARY: 113 mg/dL — AB (ref 65–99)
GLUCOSE-CAPILLARY: 131 mg/dL — AB (ref 65–99)
Glucose-Capillary: 106 mg/dL — ABNORMAL HIGH (ref 65–99)
Glucose-Capillary: 166 mg/dL — ABNORMAL HIGH (ref 65–99)

## 2016-01-19 MED ORDER — ONDANSETRON HCL 4 MG/2ML IJ SOLN: 4.0000 mg | Freq: Four times a day (QID) | INTRAMUSCULAR | Status: DC | PRN

## 2016-01-19 MED ORDER — ACETAMINOPHEN 650 MG RE SUPP: 650.0000 mg | Freq: Four times a day (QID) | RECTAL | Status: DC | PRN

## 2016-01-19 MED ORDER — HEPARIN SODIUM (PORCINE) 5000 UNIT/ML IJ SOLN: 5000.0000 [IU] | Freq: Three times a day (TID) | INTRAMUSCULAR | Status: DC

## 2016-01-19 MED ORDER — ONDANSETRON HCL 4 MG PO TABS: 4.0000 mg | ORAL_TABLET | Freq: Four times a day (QID) | ORAL | Status: DC | PRN

## 2016-01-19 MED ORDER — SODIUM CHLORIDE 0.9% FLUSH
3.0000 mL | Freq: Two times a day (BID) | INTRAVENOUS | Status: DC
  Administered 2016-01-19: 3 mL via INTRAVENOUS

## 2016-01-19 MED ORDER — ACETAMINOPHEN 325 MG PO TABS: 650.0000 mg | ORAL_TABLET | Freq: Four times a day (QID) | ORAL | Status: DC | PRN

## 2016-01-19 MED ORDER — INSULIN PUMP
Freq: Three times a day (TID) | SUBCUTANEOUS | Status: DC
  Filled 2016-01-19: qty 1

## 2016-01-19 NOTE — Progress Notes (Signed)
[facility]  ROUNDING NOTE   Subjective:   Emergent hemodialysis last night for hyperkalemia, K of 7 with metabolic acidosis. UF of 2 litres.  Last dialysis before that was Saturday.  Missed dialysis due to Halloween then to GI illness.   Patient sitting up in bed this morning. Eating breakfast. Has no complaints.   Objective:  Vital signs in last 24 hours:  Temp:  [98 F (36.7 C)-98.1 F (36.7 C)] 98.1 F (36.7 C) (11/03 0553) Pulse Rate:  [53-90] 90 (11/03 0553) Resp:  [14-20] 20 (11/03 0553) BP: (128-164)/(62-78) 147/62 (11/03 0553) SpO2:  [92 %-100 %] 100 % (11/03 0800) Weight:  [55.3 kg (122 lb)-58.4 kg (128 lb 12 oz)] 56.3 kg (124 lb 1.9 oz) (11/03 0435)  Weight change:  Filed Weights   01/18/16 1944 01/19/16 0058 01/19/16 0435  Weight: 55.3 kg (122 lb) 58.4 kg (128 lb 12 oz) 56.3 kg (124 lb 1.9 oz)    Intake/Output: I/O last 3 completed shifts: In: 120 [P.O.:120] Out: 2000 [Other:2000]   Intake/Output this shift:  No intake/output data recorded.  Physical Exam: General: NAD,   Head: Normocephalic, atraumatic. Moist oral mucosal membranes  Eyes: Anicteric, PERRL  Neck: Supple, trachea midline  Lungs:  Clear to auscultation  Heart: Regular rate and rhythm  Abdomen:  Soft, nontender, RLQ allograft kidney  Extremities: no peripheral edema.  Neurologic: Nonfocal, moving all four extremities  Skin: No lesions  Access: LIJ permcath    Basic Metabolic Panel:  Recent Labs Lab 01/18/16 1946 01/19/16 0235  NA 131* 137  K 7.0* 3.3*  CL 94* 97*  CO2 17* 26  GLUCOSE 235* 130*  BUN 71* 32*  CREATININE 11.23* 5.44*  CALCIUM 8.5* 8.1*    Liver Function Tests:  Recent Labs Lab 01/18/16 1946  AST 14*  ALT 10*  ALKPHOS 109  BILITOT 0.8  PROT 8.4*  ALBUMIN 3.8   No results for input(s): LIPASE, AMYLASE in the last 168 hours. No results for input(s): AMMONIA in the last 168 hours.  CBC:  Recent Labs Lab 01/18/16 1946 01/19/16 0535   WBC 9.2 5.9  HGB 9.1* 9.1*  HCT 28.9* 28.0*  MCV 71.3* 70.8*  PLT 391 395    Cardiac Enzymes: No results for input(s): CKTOTAL, CKMB, CKMBINDEX, TROPONINI in the last 168 hours.  BNP: Invalid input(s): POCBNP  CBG:  Recent Labs Lab 01/18/16 2348 01/19/16 0024 01/19/16 0203 01/19/16 0245 01/19/16 0749  GLUCAP 184* 166* 113* 131* 106*    Microbiology: Results for orders placed or performed during the hospital encounter of 07/03/14  Wound culture     Status: None   Collection Time: 07/04/14  2:45 PM  Result Value Ref Range Status   Micro Text Report   Final       SOURCE: RIGHT HIP FLUID    COMMENT                   NO GROWTH AEROBICALLY/ANAEROBICALLY IN 4 DAYS   GRAM STAIN                MODERATE RED BLOOD CELLS   GRAM STAIN                FEW WHITE BLOOD CELLS   GRAM STAIN                NO ORGANISMS SEEN   ANTIBIOTIC  Coagulation Studies: No results for input(s): LABPROT, INR in the last 72 hours.  Urinalysis: No results for input(s): COLORURINE, LABSPEC, PHURINE, GLUCOSEU, HGBUR, BILIRUBINUR, KETONESUR, PROTEINUR, UROBILINOGEN, NITRITE, LEUKOCYTESUR in the last 72 hours.  Invalid input(s): APPERANCEUR    Imaging: No results found.   Medications:     . albuterol      . heparin  5,000 Units Subcutaneous Q8H  . insulin aspart  6 Units Intravenous Once  . insulin pump   Subcutaneous TID AC, HS, 0200  . meclizine      . sodium chloride flush  3 mL Intravenous Q12H   acetaminophen **OR** acetaminophen, ondansetron **OR** ondansetron (ZOFRAN) IV  Assessment/ Plan:  Ms. [name] is a 40 y.o. 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  [facility] [place]. [place], [place]  1. End Stage Renal Disease with hyperkalemia and metabolic acidosis: required emergent hemodialysis last night. Missed two treatments.  - Discussed low potassium diet - Compliance with dialysis emphasized.  - Continue immunosuppression: tacrolimus  2. Hypertension: elevated overnight.  - labetalol, amlodipine - not on an ACE-I/ARB due to history of hyperkalemia.   3. Anemia of chronic kidney disease: hemoglobin 9.1 - epo as outpatient.   4. Secondary Hyperparathyroidism:  - Auryxia as binder   LOS: 0 [name] 11/3/20179:36 AM

## 2016-01-19 NOTE — Progress Notes (Signed)
Post dialysis 

## 2016-01-19 NOTE — Progress Notes (Signed)
Pre Dialysis 

## 2016-01-19 NOTE — Progress Notes (Signed)
Discharge instructions reviewed with the pt.   She will be pushed out via wheelchair when her ride arrives

## 2016-01-19 NOTE — ED Notes (Signed)
Pt states she doesn't feel right, she states she felt sudden onset of diaphoresis, glucose checks, VS stable. MD notified, given ok to transport

## 2016-01-19 NOTE — Care Management Obs Status (Signed)
MEDICARE OBSERVATION STATUS NOTIFICATION   Patient Details  Name: Debra DoughtyJamie R Nichelson MRN: [id number] Date of Birth: [birth year]   Medicare Observation Status Notification Given:  Yes    Berna BueCheryl Lachrista Heslin, RN 01/19/2016, 9:18 AM

## 2016-01-19 NOTE — Progress Notes (Signed)
Dialysis complete

## 2016-01-19 NOTE — Discharge Summary (Signed)
[facility] [place] [facility]   PATIENT NAME: Debra CharonJamie Stein    MR#:  [id number]  DATE OF BIRTH:  [birth year]  DATE OF ADMISSION:  01/18/2016 ADMITTING PHYSICIAN: Oralia Manisavid Willis, MD  DATE OF DISCHARGE: 01/19/2016  PRIMARY CARE PHYSICIAN: Elizabeth PalauANDERSON,TERESA, FNP    ADMISSION DIAGNOSIS:  Hyperkalemia [E87.5] Nausea [R11.0]  DISCHARGE DIAGNOSIS:  Principal Problem:   Hyperkalemia Active Problems:   ESRD on dialysis (HCC)   Diabetes (HCC)   Seizures (HCC)   Anxiety   SECONDARY DIAGNOSIS:   Past Medical History:  Diagnosis Date  . Allergy   . Anxiety   . Cataract   . Diabetes mellitus without complication (HCC)    history of/had transplant/tn  . ESRD on dialysis [facility](HCC) 04/08/2011   Kidney transplant  . Heart murmur   . Seizures [facility](HCC)     HOSPITAL COURSE:   40 year old female with a history of end-stage renal disease on hemodialysis and diabetes using insulin pump who presents with hyperkalemia.  1. Hyperkalemia: Patient was initially treated with calcium, insulin and dextrose. She underwent emergent dialysis. Her potassium is now 3.3. She is asymptomatic. There are no EKG changes or telemetry changes. She missed hemodialysis which is likely the reason for her hyperkalemia.  2. ESRD on hemodialysis: Patient underwent emergent dialysis. She will resume her regular outpatient dialysis which is Saturday, Monday, Tuesday and Thursday.  3. History of seizure: Patient will continue outpatient medications.  4. GERD: Continue PPI.  5. Diabetes: Patient has a in splint pump.  DISCHARGE CONDITIONS AND DIET:   Stable Diabetic/renal diet  CONSULTS OBTAINED:  Treatment Team:  Mosetta PigeonHarmeet Singh, MD  DRUG ALLERGIES:   Allergies  Allergen Reactions  . Clarithromycin Rash    Can not take due to transplant  . Enoxaparin Sodium Rash  . Entex T [Pseudoephedrine-Guaifenesin] Rash  . Levetiracetam Other (See Comments)    Constant Migraine  . Quinine Derivatives  Rash    vomitting  . Ciprofloxacin Other (See Comments)    tingling  . Keppra [Levetiracetam] Other (See Comments)    Headache  . Latex     ONLY CONDOMS  . Naproxen Sodium Other (See Comments)    Ate lining of stomach/ Can not take due to transplant/tn  . Nsaids     Can not take due to kidney transplant/tn  . Scopolamine Rash    DISCHARGE MEDICATIONS:   Current Discharge Medication List    CONTINUE these medications which have NOT CHANGED   Details  amLODipine (NORVASC) 10 MG tablet Take 10 mg by mouth daily.    AURYXIA 1 GM 210 MG(Fe) TABS Take 420 mg by mouth 2 (two) times daily before a meal.    labetalol (NORMODYNE) 200 MG tablet Take 1 tablet by mouth 3 (three) times daily. Refills: 5    LamoTRIgine (LAMICTAL XR) 100 MG TB24 Take 300 mg by mouth daily.    levonorgestrel (MIRENA) 20 MCG/24HR IUD 1 Intra Uterine Device (1 each total) by Intrauterine route once. Will place in clinic Qty: 1 each, Refills: 0   Associated Diagnoses: Encounter for IUD insertion    NOVOLOG 100 UNIT/ML injection Inject 0-30 Units into the skin daily. Via insulin pump    omeprazole (PRILOSEC) 20 MG capsule Take 20 mg by mouth daily.    ondansetron (ZOFRAN) 4 MG tablet Take 4 mg by mouth every 8 (eight) hours as needed.    rizatriptan (MAXALT) 10 MG tablet Take 10 mg by mouth as needed. May repeat in 2 hours  if needed    tacrolimus (PROGRAF) 1 MG capsule Take 3 mg by mouth 2 (two) times daily.     levonorgestrel-ethinyl estradiol (AVIANE,ALESSE,LESSINA) 0.1-20 MG-MCG tablet Take 1 tablet by mouth daily. Qty: 1 Package, Refills: 11    metroNIDAZOLE (FLAGYL) 500 MG tablet Take 1 tablet (500 mg total) by mouth 2 (two) times daily. Qty: 14 tablet, Refills: 0    minocycline (MINOCIN,DYNACIN) 100 MG capsule Take 1 capsule (100 mg total) by mouth 2 (two) times daily. Qty: 14 capsule, Refills: 0              Today   CHIEF COMPLAINT:   Patient doing well this morning. No acute  events overnight.   VITAL SIGNS:  Blood pressure (!) 147/62, pulse 90, temperature 98.1 F (36.7 C), temperature source Oral, resp. rate 20, height 5\' 5"  (1.651 m), weight 56.3 kg (124 lb 1.9 oz), SpO2 100 %.   REVIEW OF SYSTEMS:  Review of Systems  Constitutional: Negative.  Negative for chills, fever and malaise/fatigue.  HENT: Negative.  Negative for ear discharge, ear pain, hearing loss, nosebleeds and sore throat.   Eyes: Negative.  Negative for blurred vision and pain.  Respiratory: Negative.  Negative for cough, hemoptysis, shortness of breath and wheezing.   Cardiovascular: Negative.  Negative for chest pain, palpitations and leg swelling.  Gastrointestinal: Negative.  Negative for abdominal pain, blood in stool, diarrhea, nausea and vomiting.  Genitourinary: Negative.  Negative for dysuria.  Musculoskeletal: Negative.  Negative for back pain.  Skin: Negative.   Neurological: Negative for dizziness, tremors, speech change, focal weakness, seizures and headaches.  Endo/Heme/Allergies: Negative.  Does not bruise/bleed easily.  Psychiatric/Behavioral: Negative.  Negative for depression, hallucinations and suicidal ideas.     PHYSICAL EXAMINATION:  GENERAL:  40 y.o.-year-old patient lying in the bed with no acute distress.  NECK:  Supple, no jugular venous distention. No thyroid enlargement, no tenderness.  LUNGS: Normal breath sounds bilaterally, no wheezing, rales,rhonchi  No use of accessory muscles of respiration.  CARDIOVASCULAR: S1, S2 normal. No murmurs, rubs, or gallops.  ABDOMEN: Soft, non-tender, non-distended. Bowel sounds present. No organomegaly or mass.  EXTREMITIES: No pedal edema, cyanosis, or clubbing.  PSYCHIATRIC: The patient is alert and oriented x 3.  SKIN: No obvious rash, lesion, or ulcer.   DATA REVIEW:   CBC  Recent Labs Lab 01/19/16 0535  WBC 5.9  HGB 9.1*  HCT 28.0*  PLT 395    Chemistries   Recent Labs Lab 01/18/16 1946  01/19/16 0235  NA 131* 137  K 7.0* 3.3*  CL 94* 97*  CO2 17* 26  GLUCOSE 235* 130*  BUN 71* 32*  CREATININE 11.23* 5.44*  CALCIUM 8.5* 8.1*  AST 14*  --   ALT 10*  --   ALKPHOS 109  --   BILITOT 0.8  --     Cardiac Enzymes No results for input(s): TROPONINI in the last 168 hours.  Microbiology Results  @[email or web address] @  RADIOLOGY:  No results found.    Management plans discussed with the patient and she is in agreement. Stable for discharge home  Patient should follow up with pcp  CODE STATUS:     Code Status Orders        Start     Ordered   01/19/16 0051  Full code  Continuous     01/19/16 0050    Code Status History    Date Active Date Inactive Code Status Order ID Comments User Context   This  patient has a current code status but no historical code status.      TOTAL TIME TAKING CARE OF THIS PATIENT: 35 minutes.  D/w Dr Ronn Melenakolloru and nursing  Note: This dictation was prepared with Dragon dictation along with smaller phrase technology. Any transcriptional errors that result from this process are unintentional.  Temeca Somma M.D on 01/19/2016 at 11:12 AM  Between 7am to 6pm - Pager - [phone] After 6pm go to [email or web address] - Social research officer, governmentpassword [email or web address] [email or web address]  [facility]  Office  [phone]  CC: Primary care physician; Elizabeth PalauANDERSON,TERESA, FNP

## 2016-01-19 NOTE — Progress Notes (Signed)
Dialysis started 

## 2016-01-20 LAB — HEPATITIS B SURFACE ANTIGEN: HEP B S AG: NEGATIVE

## 2016-01-20 LAB — HEPATITIS B SURFACE ANTIBODY, QUANTITATIVE: HEPATITIS B-POST: 81.2 m[IU]/mL

## 2016-04-04 ENCOUNTER — Observation Stay
Admission: EM | Admit: 2016-04-04 | Discharge: 2016-04-05 | Disposition: A | Discharge status: Home / Self Care | Payer: Medicare Other | Source: Home / Self Care | Attending: Emergency Medicine | Admitting: Emergency Medicine

## 2016-04-04 DIAGNOSIS — Z940 Kidney transplant status: Secondary | ICD-10-CM | POA: Insufficient documentation

## 2016-04-04 DIAGNOSIS — I517 Cardiomegaly: Secondary | ICD-10-CM | POA: Insufficient documentation

## 2016-04-04 DIAGNOSIS — F419 Anxiety disorder, unspecified: Secondary | ICD-10-CM

## 2016-04-04 DIAGNOSIS — Z975 Presence of (intrauterine) contraceptive device: Secondary | ICD-10-CM | POA: Insufficient documentation

## 2016-04-04 DIAGNOSIS — Z79899 Other long term (current) drug therapy: Secondary | ICD-10-CM | POA: Insufficient documentation

## 2016-04-04 DIAGNOSIS — D62 Acute posthemorrhagic anemia: Secondary | ICD-10-CM

## 2016-04-04 DIAGNOSIS — D631 Anemia in chronic kidney disease: Secondary | ICD-10-CM | POA: Insufficient documentation

## 2016-04-04 DIAGNOSIS — I120 Hypertensive chronic kidney disease with stage 5 chronic kidney disease or end stage renal disease: Secondary | ICD-10-CM | POA: Insufficient documentation

## 2016-04-04 DIAGNOSIS — Z888 Allergy status to other drugs, medicaments and biological substances status: Secondary | ICD-10-CM | POA: Insufficient documentation

## 2016-04-04 DIAGNOSIS — Z9889 Other specified postprocedural states: Secondary | ICD-10-CM

## 2016-04-04 DIAGNOSIS — K922 Gastrointestinal hemorrhage, unspecified: Secondary | ICD-10-CM | POA: Insufficient documentation

## 2016-04-04 DIAGNOSIS — K625 Hemorrhage of anus and rectum: Secondary | ICD-10-CM | POA: Diagnosis present

## 2016-04-04 DIAGNOSIS — N2581 Secondary hyperparathyroidism of renal origin: Secondary | ICD-10-CM

## 2016-04-04 DIAGNOSIS — E1022 Type 1 diabetes mellitus with diabetic chronic kidney disease: Secondary | ICD-10-CM

## 2016-04-04 DIAGNOSIS — Z79891 Long term (current) use of opiate analgesic: Secondary | ICD-10-CM | POA: Insufficient documentation

## 2016-04-04 DIAGNOSIS — Z992 Dependence on renal dialysis: Secondary | ICD-10-CM

## 2016-04-04 DIAGNOSIS — Z886 Allergy status to analgesic agent status: Secondary | ICD-10-CM | POA: Insufficient documentation

## 2016-04-04 DIAGNOSIS — D735 Infarction of spleen: Secondary | ICD-10-CM | POA: Insufficient documentation

## 2016-04-04 DIAGNOSIS — E875 Hyperkalemia: Secondary | ICD-10-CM | POA: Insufficient documentation

## 2016-04-04 DIAGNOSIS — Z880 Allergy status to penicillin: Secondary | ICD-10-CM | POA: Insufficient documentation

## 2016-04-04 DIAGNOSIS — Z91040 Latex allergy status: Secondary | ICD-10-CM

## 2016-04-04 DIAGNOSIS — Z794 Long term (current) use of insulin: Secondary | ICD-10-CM | POA: Insufficient documentation

## 2016-04-04 DIAGNOSIS — N186 End stage renal disease: Secondary | ICD-10-CM

## 2016-04-04 DIAGNOSIS — Z8249 Family history of ischemic heart disease and other diseases of the circulatory system: Secondary | ICD-10-CM

## 2016-04-04 DIAGNOSIS — G40909 Epilepsy, unspecified, not intractable, without status epilepticus: Secondary | ICD-10-CM

## 2016-04-04 HISTORY — DX: Encounter for other specified aftercare: Z51.89

## 2016-04-04 HISTORY — DX: Essential (primary) hypertension: I10

## 2016-04-04 HISTORY — DX: Anemia, unspecified: D64.9

## 2016-04-04 LAB — PROTIME-INR
INR: 1.27
PROTHROMBIN TIME: 16 s — AB (ref 11.4–15.2)

## 2016-04-04 LAB — COMPREHENSIVE METABOLIC PANEL
ALT: 9 U/L — ABNORMAL LOW (ref 14–54)
AST: 24 U/L (ref 15–41)
Albumin: 3.1 g/dL — ABNORMAL LOW (ref 3.5–5.0)
Alkaline Phosphatase: 106 U/L (ref 38–126)
Anion gap: 10 (ref 5–15)
BUN: 39 mg/dL — ABNORMAL HIGH (ref 6–20)
CALCIUM: 8.2 mg/dL — AB (ref 8.9–10.3)
CO2: 23 mmol/L (ref 22–32)
Chloride: 103 mmol/L (ref 101–111)
Creatinine, Ser: 5.38 mg/dL — ABNORMAL HIGH (ref 0.44–1.00)
GFR calc non Af Amer: 9 mL/min — ABNORMAL LOW (ref >60)
GFR, EST AFRICAN AMERICAN: 11 mL/min — AB (ref >60)
GLUCOSE: 205 mg/dL — AB (ref 65–99)
POTASSIUM: 5.5 mmol/L — AB (ref 3.5–5.1)
Sodium: 136 mmol/L (ref 135–145)
Total Bilirubin: 0.8 mg/dL (ref 0.3–1.2)
Total Protein: 7 g/dL (ref 6.5–8.1)

## 2016-04-04 LAB — MRSA PCR SCREENING: MRSA BY PCR: NEGATIVE

## 2016-04-04 LAB — CBC
HEMATOCRIT: 28.4 % — AB (ref 35.0–47.0)
Hemoglobin: 9.3 g/dL — ABNORMAL LOW (ref 12.0–16.0)
MCH: 29.7 pg (ref 26.0–34.0)
MCHC: 32.9 g/dL (ref 32.0–36.0)
MCV: 90.4 fL (ref 80.0–100.0)
Platelets: 352 10*3/uL (ref 150–440)
RBC: 3.14 MIL/uL — AB (ref 3.80–5.20)
RDW: 26.7 % — ABNORMAL HIGH (ref 11.5–14.5)
WBC: 5.9 10*3/uL (ref 3.6–11.0)

## 2016-04-04 LAB — GLUCOSE, CAPILLARY: Glucose-Capillary: 184 mg/dL — ABNORMAL HIGH (ref 65–99)

## 2016-04-04 LAB — TYPE AND SCREEN
ABO/RH(D): O POS
ANTIBODY SCREEN: NEGATIVE

## 2016-04-04 LAB — HEMOGLOBIN
HEMOGLOBIN: 7 g/dL — AB (ref 12.0–16.0)
Hemoglobin: 7.9 g/dL — ABNORMAL LOW (ref 12.0–16.0)
Hemoglobin: 8.4 g/dL — ABNORMAL LOW (ref 12.0–16.0)

## 2016-04-04 LAB — APTT: APTT: 31 s (ref 24–36)

## 2016-04-04 MED ORDER — INSULIN PUMP
Freq: Three times a day (TID) | SUBCUTANEOUS | Status: DC
  Administered 2016-04-04 – 2016-04-05 (×3): via SUBCUTANEOUS
  Filled 2016-04-04: qty 1

## 2016-04-04 MED ORDER — LABETALOL HCL 100 MG PO TABS
200.0000 mg | ORAL_TABLET | Freq: Three times a day (TID) | ORAL | Status: DC
  Administered 2016-04-04 – 2016-04-05 (×4): 200 mg via ORAL
  Filled 2016-04-04 (×2): qty 2
  Filled 2016-04-04 (×3): qty 1
  Filled 2016-04-04: qty 2

## 2016-04-04 MED ORDER — SODIUM CHLORIDE 0.9 % IV SOLN: 250.0000 mL | INTRAVENOUS | Status: DC | PRN

## 2016-04-04 MED ORDER — NON FORMULARY: 1.0000 | Freq: Every day | Status: DC

## 2016-04-04 MED ORDER — ONDANSETRON HCL 4 MG PO TABS: 4.0000 mg | ORAL_TABLET | Freq: Four times a day (QID) | ORAL | Status: DC | PRN

## 2016-04-04 MED ORDER — LAMOTRIGINE 100 MG PO TABS
ORAL_TABLET | ORAL | Status: AC
  Filled 2016-04-04: qty 3

## 2016-04-04 MED ORDER — EPOETIN ALFA 10000 UNIT/ML IJ SOLN
10000.0000 [IU] | INTRAMUSCULAR | Status: DC
  Administered 2016-04-04: 10000 [IU] via INTRAVENOUS
  Filled 2016-04-04: qty 1

## 2016-04-04 MED ORDER — POLYETHYLENE GLYCOL 3350 17 G PO PACK: 17.0000 g | PACK | Freq: Every day | ORAL | Status: DC | PRN

## 2016-04-04 MED ORDER — OXYCODONE HCL 5 MG PO TABS
5.0000 mg | ORAL_TABLET | ORAL | Status: DC | PRN
  Administered 2016-04-04: 5 mg via ORAL
  Filled 2016-04-04: qty 1

## 2016-04-04 MED ORDER — PROMETHAZINE HCL 25 MG/ML IJ SOLN
12.5000 mg | Freq: Four times a day (QID) | INTRAMUSCULAR | Status: DC | PRN
  Administered 2016-04-04 – 2016-04-05 (×2): 12.5 mg via INTRAVENOUS
  Filled 2016-04-04 (×2): qty 1

## 2016-04-04 MED ORDER — AMLODIPINE BESYLATE 10 MG PO TABS
10.0000 mg | ORAL_TABLET | Freq: Every day | ORAL | Status: DC
  Administered 2016-04-04 – 2016-04-05 (×2): 10 mg via ORAL
  Filled 2016-04-04: qty 2
  Filled 2016-04-04: qty 1

## 2016-04-04 MED ORDER — SODIUM CHLORIDE 0.9% FLUSH
3.0000 mL | Freq: Two times a day (BID) | INTRAVENOUS | Status: DC
  Administered 2016-04-04 – 2016-04-05 (×3): 3 mL via INTRAVENOUS

## 2016-04-04 MED ORDER — LAMOTRIGINE ER 100 MG PO TB24
300.0000 mg | ORAL_TABLET | Freq: Every day | ORAL | Status: DC
  Filled 2016-04-04: qty 3

## 2016-04-04 MED ORDER — LAMOTRIGINE 100 MG PO TABS: 300.0000 mg | ORAL_TABLET | Freq: Every day | ORAL | Status: DC

## 2016-04-04 MED ORDER — PANTOPRAZOLE SODIUM 40 MG IV SOLR
40.0000 mg | Freq: Two times a day (BID) | INTRAVENOUS | Status: DC
  Administered 2016-04-04 – 2016-04-05 (×3): 40 mg via INTRAVENOUS
  Filled 2016-04-04 (×3): qty 40

## 2016-04-04 MED ORDER — ACETAMINOPHEN 650 MG RE SUPP: 650.0000 mg | Freq: Four times a day (QID) | RECTAL | Status: DC | PRN

## 2016-04-04 MED ORDER — SODIUM CHLORIDE 0.9% FLUSH: 3.0000 mL | INTRAVENOUS | Status: DC | PRN

## 2016-04-04 MED ORDER — ONDANSETRON HCL 4 MG/2ML IJ SOLN
4.0000 mg | Freq: Four times a day (QID) | INTRAMUSCULAR | Status: DC | PRN
  Administered 2016-04-04: 4 mg via INTRAVENOUS
  Filled 2016-04-04: qty 2

## 2016-04-04 MED ORDER — TACROLIMUS 1 MG PO CAPS
3.0000 mg | ORAL_CAPSULE | Freq: Two times a day (BID) | ORAL | Status: DC
  Administered 2016-04-04 – 2016-04-05 (×3): 3 mg via ORAL
  Filled 2016-04-04 (×5): qty 3

## 2016-04-04 MED ORDER — ACETAMINOPHEN 325 MG PO TABS: 650.0000 mg | ORAL_TABLET | Freq: Four times a day (QID) | ORAL | Status: DC | PRN

## 2016-04-04 MED ORDER — SEVELAMER CARBONATE 800 MG PO TABS
1600.0000 mg | ORAL_TABLET | Freq: Three times a day (TID) | ORAL | Status: DC
  Administered 2016-04-04 – 2016-04-05 (×3): 1600 mg via ORAL
  Filled 2016-04-04 (×4): qty 2

## 2016-04-04 MED ORDER — HYDROCODONE-ACETAMINOPHEN 5-325 MG PO TABS
1.0000 | ORAL_TABLET | ORAL | Status: DC | PRN
  Administered 2016-04-04: 1 via ORAL
  Filled 2016-04-04: qty 1

## 2016-04-04 MED ORDER — LAMOTRIGINE ER 100 MG PO TB24
300.0000 mg | ORAL_TABLET | Freq: Every day | ORAL | Status: DC
  Administered 2016-04-04: 300 mg via ORAL
  Filled 2016-04-04: qty 3

## 2016-04-04 MED ORDER — LORATADINE 10 MG PO TABS
10.0000 mg | ORAL_TABLET | Freq: Every day | ORAL | Status: DC
  Administered 2016-04-04: 10 mg via ORAL
  Filled 2016-04-04: qty 1

## 2016-04-04 NOTE — ED Notes (Signed)
IV attempt to left hand with 22GA cath without success; Attempted IV start to left anterior FA with 20GA without success; pt tolerated well

## 2016-04-04 NOTE — ED Provider Notes (Signed)
[facility] Emergency Department Provider Note  ____________________________________________  Time seen: Approximately 4:45 AM  I have reviewed the triage vital signs and the nursing notes.   HISTORY  Chief Complaint Rectal Bleeding    HPI [name] is a 41 y.o. female w/ ESRD on HD s/p failed kidney/pancreas transplant, hx of BRBPR presenting w/ BRBPR.  The patient reports that at 2 AM, she went to have a bowel movement and only bright red blood came out of her rectum. She's had 5 more episodes since then. She denies any chest pain, lightheadedness, or fainting. No fever. No pain with defecation or abdominal pain.  Last HD completed Tues.   Past Medical History:  Diagnosis Date  . Allergy   . Anxiety   . Cataract   . Diabetes mellitus without complication (HCC)    history of/had transplant/tn  . ESRD on dialysis [facility]) 04/08/2011   Kidney transplant  . Heart murmur   . Seizures [facility])     Patient Active Problem List   Diagnosis Date Noted  . BRBPR (bright red blood per rectum) 04/04/2016  . Hyperkalemia 01/18/2016  . ESRD on dialysis (HCC) 01/18/2016  . Diabetes (HCC) 01/18/2016  . Seizures (HCC) 01/18/2016  . Anxiety 01/18/2016  . Cervicitis 02/09/2014  . Pelvic pain in female 02/09/2014    Past Surgical History:  Procedure Laterality Date  . catheter surgery for dialysis  2011  . catheter surgery for dialysis  2012  . COMBINED KIDNEY-PANCREAS TRANSPLANT  2013  . ELBOW SURGERY  2009  . FEMUR SURGERY  2001   rod removed  . left fem  1999   rod in left femur  . REMOVAL OF A DIALYSIS CATHETER  2012   removed due to infection/tn  . ulna surgery  1999   plate put in arm  . WRIST SURGERY  2006   right wrist broken    Current Outpatient Rx  . Order #: [id number] Class: Historical Med  . Order #: [id number] Class: Historical Med  . Order #: [id number] Class: Historical Med  . Order #: [id number] Class: Historical Med  . Order #:  [id number] Class: Historical Med  . Order #: [id number] Class: No Print  . Order #: [id number] Class: Historical Med  . Order #: [id number] Class: Historical Med  . Order #: [id number] Class: Historical Med  . Order #: [id number] Class: Historical Med  . Order #: [id number] Class: Historical Med  . Order #: [id number] Class: Historical Med  . Order #: [id number] Class: Historical Med  . Order #: [id number] Class: Historical Med    Allergies Clarithromycin; Enoxaparin sodium; Entex t [pseudoephedrine-guaifenesin]; Levetiracetam; Quinine derivatives; Ciprofloxacin; Keppra [levetiracetam]; Latex; Naproxen sodium; Nsaids; and Scopolamine  Family History  Problem Relation Age of Onset  . Heart disease Father   . Hypertension Maternal Grandmother   . Hypertension Maternal Grandfather   . Heart disease Paternal Grandmother   . Hypertension Paternal Grandmother   . Hypertension Paternal Grandfather     Social History Social History  Substance Use Topics  . Smoking status: Never Smoker  . Smokeless tobacco: Never Used  . Alcohol use No    Review of Systems Constitutional: No fever/chills.No lightheadedness or syncope. Eyes: No visual changes. ENT: No sore throat. No congestion or rhinorrhea. Cardiovascular: Denies chest pain. Denies palpitations. Respiratory: Denies shortness of breath.  No cough. Gastrointestinal: No abdominal pain.  No nausea, no vomiting.  No diarrhea.  No constipation. Genitourinary: Negative for dysuria.  + BRBPR Musculoskeletal: Negative for back pain. Skin: Negative for rash. Neurological: Negative for headaches.  No focal numbness, tingling or weakness.   10-point ROS otherwise negative.  ____________________________________________   PHYSICAL EXAM:  VITAL SIGNS: ED Triage Vitals  Enc Vitals Group     BP 04/04/16 0407 117/78     Pulse Rate 04/04/16 0407 69     Resp 04/04/16 0407 18     Temp 04/04/16 0407 97.5 F (36.4 C)     Temp Source 04/04/16 0407 Oral     SpO2  04/04/16 0407 100 %     Weight 04/04/16 0405 120 lb (54.4 kg)     Height 04/04/16 0405 5\' 4"  (1.626 m)     Head Circumference --      Peak Flow --      Pain Score --      Pain Loc --      Pain Edu? --      Excl. in GC? --     Constitutional: Alert and oriented. Chronically ill appearing but in no acute distress. Answers questions appropriately. Eyes: Conjunctivae are normal.  EOMI. No scleral icterus. Head: Atraumatic. Nose: No congestion/rhinnorhea. Mouth/Throat: Mucous membranes are moist.  Neck: No stridor.  Supple.  + JVD. Cardiovascular: Normal rate, regular rhythm. No murmurs, rubs or gallops. R chest permcath w/o swelling, tenderness, or discharge. Respiratory: Normal respiratory effort.  No accessory muscle use or retractions. Lungs CTAB.  No wheezes, rales or ronchi. Gastrointestinal: Soft, nontender and nondistended.  No guarding or rebound.  No peritoneal signs. Genitourinary: No evidence of external or palpable internal hemorrhoids. Stool is bloody and guaiac positive. Musculoskeletal: Bilateral nonpitting lower extremity edema around the ankles. Old healed scarring over the right tibia. Neurologic:  A&Ox3.  Speech is clear.  Face and smile are symmetric.  EOMI.  Moves all extremities well. Skin:  Skin is warm, dry and intact. No rash noted. Mild jaundice of the skin noted. Psychiatric: Mood and affect are normal. Speech and behavior are normal.  Normal judgement.  ____________________________________________   LABS (all labs ordered are listed, but only abnormal results are displayed)  Labs Reviewed  CBC - Abnormal; Notable for the following:       Result Value   RBC 3.14 (*)    Hemoglobin 9.3 (*)    HCT 28.4 (*)    RDW 26.7 (*)    All other components within normal limits  COMPREHENSIVE METABOLIC PANEL - Abnormal; Notable for the following:    Potassium 5.5 (*)    Glucose, Bld 205 (*)    BUN 39 (*)    Creatinine, Ser 5.38 (*)    Calcium 8.2 (*)    Albumin  3.1 (*)    ALT 9 (*)    GFR calc non Af Amer 9 (*)    GFR calc Af Amer 11 (*)    All other components within normal limits  PROTIME-INR - Abnormal; Notable for the following:    Prothrombin Time 16.0 (*)    All other components within normal limits  APTT  HEMOGLOBIN  HEMOGLOBIN  HEMOGLOBIN  TYPE AND SCREEN   ____________________________________________  EKG  ED ECG REPORT I, Rockne Menghini, the attending physician, personally viewed and interpreted this ECG.   Date: 04/04/2016  EKG Time: 526  Rate: 69  Rhythm: normal sinus rhythm  Axis: normal  Intervals:none  ST&T Change: no STEMI  ____________________________________________  RADIOLOGY  No results found.  ____________________________________________   PROCEDURES  Procedure(s) performed: None  Procedures  Critical Care performed: No ____________________________________________   INITIAL IMPRESSION / ASSESSMENT AND PLAN /  ED COURSE  Pertinent labs & imaging results that were available during my care of the patient were reviewed by me and considered in my medical decision making (see chart for details).  41 y.o. female with a history of ESRD on HD, recent episode of bright red blood per rectum, presenting with bright red blood per rectum 5 or 6. Overall, the patient has reassuring vital signs. She does have bloody stool and will require evaluation for this. Potential causes include internal hemorrhoids, diverticulitis with bleed although she does not have any abdominal pain so this would be less likely.  Plan admission.  ----------------------------------------- 5:29 AM on 04/04/2016 -----------------------------------------  The patient's blood counts are stable at this time, she remains hemodynamically stable and pain-free, plan admission. No indication for transfusion at this time.  ____________________________________________  FINAL CLINICAL IMPRESSION(S) / ED DIAGNOSES  Final diagnoses:   Bright red blood per rectum         NEW MEDICATIONS STARTED DURING THIS VISIT:  New Prescriptions   No medications on file      Rockne MenghiniAnne-Caroline Lallie Strahm, MD 04/04/16 [phone]

## 2016-04-04 NOTE — Progress Notes (Signed)
HD initiated via L HD cath without issue. No heparin tx. Patient currently has no complaints.

## 2016-04-04 NOTE — ED Notes (Signed)
Pt. Helped to toilet in room. Ambulated back to bed independently, no complications. Pt. Passed minimal drops of blood, no stool.

## 2016-04-04 NOTE — ED Triage Notes (Addendum)
Patient ambulatory to triage with steady gait, without difficulty, pt pale in color, brought in by EMS; pt reports began having rectal bleeding at 2am; denies any pain; st hx of same last mo; colonoscopy & CT scan was negative; f/u sched; dialysis due today

## 2016-04-04 NOTE — H&P (Signed)
[employer] [facility] [place] [facility]   PATIENT NAME: Debra CharonJamie Lanuza    MR#:  [id number]  DATE OF BIRTH:  12/11/1975  DATE OF ADMISSION:  04/04/2016  PRIMARY CARE PHYSICIAN: Elizabeth PalauANDERSON,TERESA, FNP   REQUESTING/REFERRING PHYSICIAN: dr Sharma Covertnorman  CHIEF COMPLAINT:   BRBPR HISTORY OF PRESENT ILLNESS:  [name]  is a [age] y.o. female with a known history of Type 1 diabetes, ESRD on hemodialysis, kidney transplant and seizure disorder who presents with above complaint. Patient was actually [facility] at the end [facility] for [facility] bleed. She underwent colonoscopy which did not identify source of bleeding. She also had a CTA without signs of mesenteric ischemia or other source of bleeding. GI had concern for possible bowel ischemia at site of pancreas anastomosis or AVMs but due to her stable hgb they had elected for OP follow up which is scheduled for the next week. She denies abdominal pain, nausea, vomiting or fevers.       PAST MEDICAL HISTORY:   Past Medical History:  Diagnosis Date  . Allergy   . Anxiety   . Cataract   . Diabetes mellitus without complication (HCC)    history of/had transplant/tn  . ESRD on dialysis [facility](HCC) 04/08/2011   Kidney transplant  . Heart murmur   . Seizures (HCC)     PAST SURGICAL HISTORY:   Past Surgical History:  Procedure Laterality Date  . catheter surgery for dialysis  2011  . catheter surgery for dialysis  2012  . COMBINED KIDNEY-PANCREAS TRANSPLANT  2013  . ELBOW SURGERY  2009  . FEMUR SURGERY  2001   rod removed  . left fem  1999   rod in left femur  . REMOVAL OF A DIALYSIS CATHETER  2012   removed due to infection/tn  . ulna surgery  1999   plate put in arm  . WRIST SURGERY  2006   right wrist broken    SOCIAL HISTORY:   Social History  Substance Use Topics  . Smoking status: Never Smoker  . Smokeless tobacco: Never Used  . Alcohol use No    FAMILY HISTORY:   Family  History  Problem Relation Age of Onset  . Heart disease Father   . Hypertension Maternal Grandmother   . Hypertension Maternal Grandfather   . Heart disease Paternal Grandmother   . Hypertension Paternal Grandmother   . Hypertension Paternal Grandfather     DRUG ALLERGIES:   Allergies  Allergen Reactions  . Clarithromycin Rash    Can not take due to transplant  . Enoxaparin Sodium Rash  . Entex T [Pseudoephedrine-Guaifenesin] Rash  . Levetiracetam Other (See Comments)    Constant Migraine  . Quinine Derivatives Rash    vomitting  . Ciprofloxacin Other (See Comments)    tingling  . Keppra [Levetiracetam] Other (See Comments)    Headache  . Latex     ONLY CONDOMS  . Naproxen Sodium Other (See Comments)    Ate lining of stomach/ Can not take due to transplant/tn  . Nsaids     Can not take due to kidney transplant/tn  . Scopolamine Rash    REVIEW OF SYSTEMS:   Review of Systems  Constitutional: Negative.  Negative for chills, fever and malaise/fatigue.  HENT: Negative.  Negative for ear discharge, ear pain, hearing loss, nosebleeds and sore throat.   Eyes: Negative.  Negative for blurred vision and pain.  Respiratory: Negative.  Negative for cough, hemoptysis, shortness of  breath and wheezing.   Cardiovascular: Positive for leg swelling. Negative for chest pain and palpitations.  Gastrointestinal: Positive for blood in stool. Negative for abdominal pain, diarrhea, nausea and vomiting.  Genitourinary: Negative.  Negative for dysuria.  Musculoskeletal: Negative.  Negative for back pain.  Skin: Negative.   Neurological: Negative for dizziness, tremors, speech change, focal weakness, seizures and headaches.  Endo/Heme/Allergies: Negative.  Does not bruise/bleed easily.  Psychiatric/Behavioral: Negative.  Negative for depression, hallucinations and suicidal ideas.    MEDICATIONS AT HOME:   Prior to Admission medications   Medication Sig Start Date End Date Taking?  Authorizing Provider  acetaminophen (TYLENOL) 500 MG tablet Take 500 mg by mouth every 6 (six) hours as needed.   Yes Historical Provider, MD  amLODipine (NORVASC) 10 MG tablet Take 10 mg by mouth daily.   Yes Historical Provider, MD  cetirizine (ZYRTEC ALLERGY) 10 MG tablet Take 10 mg by mouth daily.   Yes Historical Provider, MD  labetalol (NORMODYNE) 200 MG tablet Take 1 tablet by mouth 3 (three) times daily. 01/29/15  Yes Historical Provider, MD  LamoTRIgine (LAMICTAL XR) 100 MG TB24 Take 300 mg by mouth daily.   Yes Historical Provider, MD  levonorgestrel (MIRENA) 20 MCG/24HR IUD 1 Intra Uterine Device (1 each total) by Intrauterine route once. Will place in clinic 03/03/12  Yes Reva Bores, MD  NOVOLOG 100 UNIT/ML injection Inject 0-30 Units into the skin daily. Via insulin pump   Yes Historical Provider, MD  omeprazole (PRILOSEC) 20 MG capsule Take 20 mg by mouth daily.   Yes Historical Provider, MD  ondansetron (ZOFRAN) 4 MG tablet Take 4 mg by mouth every 8 (eight) hours as needed.   Yes Historical Provider, MD  oxyCODONE (OXY IR/ROXICODONE) 5 MG immediate release tablet Take 5 mg by mouth every 4 (four) hours as needed for severe pain.   Yes Historical Provider, MD  promethazine (PHENERGAN) 12.5 MG tablet Take 12.5 mg by mouth.   Yes Historical Provider, MD  rizatriptan (MAXALT) 10 MG tablet Take 10 mg by mouth as needed. May repeat in 2 hours if needed   Yes Historical Provider, MD  sevelamer carbonate (RENVELA) 800 MG tablet Take 1,600 mg by mouth 3 (three) times daily with meals.   Yes Historical Provider, MD  tacrolimus (PROGRAF) 1 MG capsule Take 3 mg by mouth 2 (two) times daily.    Yes Historical Provider, MD      VITAL SIGNS:  Blood pressure (!) 163/85, pulse 71, temperature 97.5 F (36.4 C), temperature source Oral, resp. rate 12, height 5\' 4"  (1.626 m), weight 54.4 kg (120 lb), SpO2 97 %.  PHYSICAL EXAMINATION:   Physical Exam  Constitutional: She is oriented to person,  place, and time and well-developed, well-nourished, and in no distress. No distress.  HENT:  Head: Normocephalic.  Eyes: No scleral icterus.  Neck: Normal range of motion. Neck supple. No JVD present. No tracheal deviation present.  Cardiovascular: Normal rate, regular rhythm and normal heart sounds.  Exam reveals no gallop and no friction rub.   No murmur heard. Pulmonary/Chest: Effort normal and breath sounds normal. No respiratory distress. She has no wheezes. She has no rales. She exhibits no tenderness.  Abdominal: Soft. Bowel sounds are normal. She exhibits no distension and no mass. There is no tenderness. There is no rebound and no guarding.  Musculoskeletal: Normal range of motion. She exhibits edema.  Neurological: She is alert and oriented to person, place, and time.  Skin: Skin is  warm. No rash noted. No erythema.  Psychiatric: Affect and judgment normal.      LABORATORY PANEL:   CBC  Recent Labs Lab 04/04/16 0438  WBC 5.9  HGB 9.3*  HCT 28.4*  PLT 352   ------------------------------------------------------------------------------------------------------------------  Chemistries   Recent Labs Lab 04/04/16 0438  NA 136  K 5.5*  CL 103  CO2 23  GLUCOSE 205*  BUN 39*  CREATININE 5.38*  CALCIUM 8.2*  AST 24  ALT 9*  ALKPHOS 106  BILITOT 0.8   ------------------------------------------------------------------------------------------------------------------  Cardiac Enzymes No results for input(s): TROPONINI in the last 168 hours. ------------------------------------------------------------------------------------------------------------------  RADIOLOGY:  No results found.  EKG:   none  IMPRESSION AND PLAN:   41 year old female with ESRD on hemodialysis and renal transplant he was admitted at the end of December for bright red blood per rectum who presents again for red blood per rectum  1. Bright red blood per rectum with recent colonoscopy  at [facility] that did not reveal source of GI bleed. Patient will likely need capsule endoscopy. Consult GI Follow hemoglobin   2. Acute blood loss anemia: Discharge hemoglobin [facility] 8.8 [facility] Continue to monitor hemoglobin every 8 hours. Continue PPI  3. ESRD on hemodialysis Monday, Tuesday, Thursday Saturday status post failed kidney transplant ESRD is due to type 1 diabetes Nephrology consultation  4. Accelerated Essential hypertension: Patient has not received dialysis her medications morning Continue Norvasc and labetalol  5. Seizure disorder on Lamictal  6. Status post renal transplant on Prograf  7. Diabetes: Sliding scale insulin All the records are reviewed and case discussed with ED provider. Management plans discussed with the patient and she is in agreement  CODE STATUS: FULL  TOTAL TIME TAKING CARE OF THIS PATIENT: 45 minutes.    Mancel Lardizabal M.D on 04/04/2016 at 6:06 AM  Between 7am to 6pm - Pager - [phone]  After 6pm go to [email or web address] - Social research officer, government  [facility]  Office  [phone]  CC: Primary care physician; Elizabeth Palau, FNP

## 2016-04-04 NOTE — ED Notes (Signed)
Report to laura, rn.  

## 2016-04-04 NOTE — Progress Notes (Signed)
Pre HD assessment  

## 2016-04-04 NOTE — ED Notes (Signed)
Patient transported to dialysis

## 2016-04-04 NOTE — Progress Notes (Addendum)
Hepatitis labs sent. Hemoglobin also collected/sent pre HD.

## 2016-04-04 NOTE — Progress Notes (Addendum)
Hemodialysis completed without issue. Total UF 0.5L without issue. Report given to PlummerErica on 2C. Patient has no complaints currently.

## 2016-04-04 NOTE — Progress Notes (Signed)
Called Dr. Allena KatzPatel and asked if we could use the insulin pump, also patient needs to take the lamictal xr not just lamictal.  He gave verbal order that it was ok to use her insulin pump and we could change her Lamictal to xr

## 2016-04-04 NOTE — Progress Notes (Signed)
[facility]  ROUNDING NOTE   Subjective:   Ms. Devonne DoughtyJamie R Stephenson admitted to [facility] 04/04/2016 for GI Bleed  Today is her dialysis day and is requesting dialysis. K is 5.5.   Woke up this morning 2:30am with melana. Hemoglobin dropped to 7.9 from 9.3.   Objective:  Vital signs in last 24 hours:  Temp:  [97.5 F (36.4 C)] 97.5 F (36.4 C) (01/18 0407) Pulse Rate:  [68-84] 84 (01/18 1044) Resp:  [9-18] 15 (01/18 1100) BP: (117-174)/(78-93) 173/91 (01/18 1100) SpO2:  [95 %-100 %] 97 % (01/18 1030) Weight:  [54.4 kg (120 lb)] 54.4 kg (120 lb) (01/18 0405)  Weight change:  Filed Weights   04/04/16 0405  Weight: 54.4 kg (120 lb)    Intake/Output: No intake/output data recorded.   Intake/Output this shift:  No intake/output data recorded.  Physical Exam: General: NAD  Head: Normocephalic, atraumatic. Moist oral mucosal membranes  Eyes: Anicteric, PERRL  Neck: Supple, trachea midline  Lungs:  Clear to auscultation  Heart: Regular rate and rhythm  Abdomen:  Soft, RLQ allograft kidney - nontender  Extremities: no peripheral edema.  Neurologic: Nonfocal, moving all four extremities  Skin: No lesions  Access: LIJ permcath    Basic Metabolic Panel:  Recent Labs Lab 04/04/16 0438  NA 136  K 5.5*  CL 103  CO2 23  GLUCOSE 205*  BUN 39*  CREATININE 5.38*  CALCIUM 8.2*    Liver Function Tests:  Recent Labs Lab 04/04/16 0438  AST 24  ALT 9*  ALKPHOS 106  BILITOT 0.8  PROT 7.0  ALBUMIN 3.1*   No results for input(s): LIPASE, AMYLASE in the last 168 hours. No results for input(s): AMMONIA in the last 168 hours.  CBC:  Recent Labs Lab 04/04/16 0438 04/04/16 0938  WBC 5.9  --   HGB 9.3* 7.9*  HCT 28.4*  --   MCV 90.4  --   PLT 352  --     Cardiac Enzymes: No results for input(s): CKTOTAL, CKMB, CKMBINDEX, TROPONINI in the last 168 hours.  BNP: Invalid input(s): POCBNP  CBG: No results for input(s): GLUCAP in the last 168  hours.  Microbiology: Results for orders placed or performed during the hospital encounter of 07/03/14  Wound culture     Status: None   Collection Time: 07/04/14  2:45 PM  Result Value Ref Range Status   Micro Text Report   Final       SOURCE: RIGHT HIP FLUID    COMMENT                   NO GROWTH AEROBICALLY/ANAEROBICALLY IN 4 DAYS   GRAM STAIN                MODERATE RED BLOOD CELLS   GRAM STAIN                FEW WHITE BLOOD CELLS   GRAM STAIN                NO ORGANISMS SEEN   ANTIBIOTIC                                                        Coagulation Studies:  Recent Labs  04/04/16 0500  LABPROT 16.0*  INR 1.27    Urinalysis:  No results for input(s): COLORURINE, LABSPEC, PHURINE, GLUCOSEU, HGBUR, BILIRUBINUR, KETONESUR, PROTEINUR, UROBILINOGEN, NITRITE, LEUKOCYTESUR in the last 72 hours.  Invalid input(s): APPERANCEUR    Imaging: No results found.   Medications:   . sodium chloride     . amLODipine  10 mg Oral Daily  . labetalol  200 mg Oral TID  . lamoTRIgine  300 mg Oral QHS  . pantoprazole (PROTONIX) IV  40 mg Intravenous Q12H  . sevelamer carbonate  1,600 mg Oral TID WC  . sodium chloride flush  3 mL Intravenous Q12H  . tacrolimus  3 mg Oral BID   sodium chloride, acetaminophen **OR** acetaminophen, HYDROcodone-acetaminophen, ondansetron **OR** ondansetron (ZOFRAN) IV, oxyCODONE, polyethylene glycol, sodium chloride flush  Assessment/ Plan:  Ms. ZAREAH HUNZEKER is a 41 y.o. white female  ith end stage renal disease status post failed kidney transplant on hemodialysis four days a week - [facility] at [facility], type I diabetes mellitus status post failed pancreas transplant, with insulin pump, hypertension, splenic thrombosis, anemia, secondary hyperparathyroidism.  TTS [facility]/ [facility] [place]. She drives from [place], [place]  1. End Stage Renal Disease with hyperkalemia: will need hemodialysis  treatment today. Unknown hepatitis B status - Orders prepared.  - Discussed low potassium diet - Continue immunosuppression: tacrolimus  2. Hypertension:   - labetalol, amlodipine - not on an ACE-I/ARB due to history of hyperkalemia.   3. Anemia of chronic kidney disease: with GI bleed. Similar presentation to [facility] on 03/12/16. Endoscopy, CTA and tagged RBC scan at that time did not reveal source.  - Getting PO iron supplements and IV iron from hematology. Unable to get IV iron at hemodialysis since she states she is allergic to it.  - epo with dialysis treatment - Appreciate GI input.   4. Secondary Hyperparathyroidism:  Previously on Auryxia as binder which also is an iron supplement - Continue to sevelamer   LOS: 0 Genetta Fiero 1/18/201812:59 PM

## 2016-04-04 NOTE — Progress Notes (Signed)
Hemodialysis- Hgb resulted 7.0. Dr. Wynelle LinkKolluru notified.

## 2016-04-04 NOTE — Progress Notes (Signed)
Pre HD  

## 2016-04-04 NOTE — Progress Notes (Signed)
Post HD assessment  

## 2016-04-05 LAB — GLUCOSE, CAPILLARY
GLUCOSE-CAPILLARY: 103 mg/dL — AB (ref 65–99)
Glucose-Capillary: 238 mg/dL — ABNORMAL HIGH (ref 65–99)
Glucose-Capillary: 160 mg/dL — ABNORMAL HIGH (ref 65–99)

## 2016-04-05 LAB — CBC
HEMATOCRIT: 26.7 % — AB (ref 35.0–47.0)
HEMOGLOBIN: 8.5 g/dL — AB (ref 12.0–16.0)
MCH: 29.2 pg (ref 26.0–34.0)
MCHC: 32 g/dL (ref 32.0–36.0)
MCV: 91.3 fL (ref 80.0–100.0)
Platelets: 300 10*3/uL (ref 150–440)
RBC: 2.92 MIL/uL — AB (ref 3.80–5.20)
RDW: 26.1 % — ABNORMAL HIGH (ref 11.5–14.5)
WBC: 4 10*3/uL (ref 3.6–11.0)

## 2016-04-05 LAB — HEPATITIS B SURFACE ANTIGEN: Hepatitis B Surface Ag: NEGATIVE

## 2016-04-05 LAB — BASIC METABOLIC PANEL
Anion gap: 9 (ref 5–15)
BUN: 20 mg/dL (ref 6–20)
CHLORIDE: 100 mmol/L — AB (ref 101–111)
CO2: 30 mmol/L (ref 22–32)
Calcium: 7.8 mg/dL — ABNORMAL LOW (ref 8.9–10.3)
Creatinine, Ser: 3.57 mg/dL — ABNORMAL HIGH (ref 0.44–1.00)
GFR calc non Af Amer: 15 mL/min — ABNORMAL LOW (ref >60)
GFR, EST AFRICAN AMERICAN: 17 mL/min — AB (ref >60)
Glucose, Bld: 221 mg/dL — ABNORMAL HIGH (ref 65–99)
POTASSIUM: 4.5 mmol/L (ref 3.5–5.1)
Sodium: 139 mmol/L (ref 135–145)

## 2016-04-05 LAB — HEPATITIS B CORE ANTIBODY, TOTAL: Hep B Core Total Ab: NEGATIVE

## 2016-04-05 LAB — HEPATITIS B SURFACE ANTIBODY,QUALITATIVE: Hep B S Ab: REACTIVE

## 2016-04-05 LAB — HEMOGLOBIN: Hemoglobin: 8.5 g/dL — ABNORMAL LOW (ref 12.0–16.0)

## 2016-04-05 MED ORDER — BUTALBITAL-APAP-CAFFEINE 50-325-40 MG PO TABS
1.0000 | ORAL_TABLET | Freq: Once | ORAL | Status: AC
  Administered 2016-04-05: 1 via ORAL
  Filled 2016-04-05: qty 1

## 2016-04-05 MED ORDER — PANTOPRAZOLE SODIUM 40 MG IV SOLR: 40.0000 mg | INTRAVENOUS | Status: DC

## 2016-04-05 NOTE — Care Management Obs Status (Signed)
MEDICARE OBSERVATION STATUS NOTIFICATION   Patient Details  Name: [name] MRN: [id number] Date of Birth: [birth year]   Medicare Observation Status Notification Given:  Yes    Chapman FitchBOWEN, Avion Patella T, RN 04/05/2016, 2:37 PM

## 2016-04-05 NOTE — Progress Notes (Signed)
Patient discharged to home as ordered, no follow up appointments given. Patient meds given as ordered. Patient is alert and oriented no acute distress noted.

## 2016-04-05 NOTE — Progress Notes (Signed)
Patient brought her medications to the hospital with her.  I told her that per protocol we needed to have family take them home or store them in the pharmacy.  She told me she would not have family up here tonight. She also refused to let us send them down to the pharmacy to be stored; stating that one other time she was here the pharmacy lost her medications.  I instructed her to not take any of her home medications and to send them with family when they came.

## 2016-04-05 NOTE — Care Management (Signed)
HD info faxed to [facility] HD liaison  

## 2016-04-05 NOTE — Progress Notes (Signed)
[facility]  ROUNDING NOTE   Subjective:   Hemodialysis yesterday. Tolerated treatment well. UF of  Objective:  Vital signs in last 24 hours:  Temp:  [98.1 F (36.7 C)-98.6 F (37 C)] 98.1 F (36.7 C) (01/19 0814) Pulse Rate:  [76-88] 81 (01/19 0814) Resp:  [12-23] 18 (01/19 0539) BP: (136-172)/(73-90) 172/84 (01/19 0814) SpO2:  [92 %-98 %] 92 % (01/19 0814) Weight:  [55.9 kg (123 lb 3.2 oz)] 55.9 kg (123 lb 3.2 oz) (01/18 1826)  Weight change: 1.452 kg (3 lb 3.2 oz) Filed Weights   04/04/16 0405 04/04/16 1826  Weight: 54.4 kg (120 lb) 55.9 kg (123 lb 3.2 oz)    Intake/Output: I/O last 3 completed shifts: In: -  Out: 500 [Other:500]   Intake/Output this shift:  No intake/output data recorded.  Physical Exam: General: NAD  Head: Normocephalic, atraumatic. Moist oral mucosal membranes  Eyes: Anicteric, PERRL  Neck: Supple, trachea midline  Lungs:  Clear to auscultation  Heart: Regular rate and rhythm  Abdomen:  Soft, RLQ allograft kidney - nontender  Extremities: no peripheral edema.  Neurologic: Nonfocal, moving all four extremities  Skin: No lesions  Access: LIJ permcath    Basic Metabolic Panel:  Recent Labs Lab 04/04/16 0438 04/05/16 0511  NA 136 139  K 5.5* 4.5  CL 103 100*  CO2 23 30  GLUCOSE 205* 221*  BUN 39* 20  CREATININE 5.38* 3.57*  CALCIUM 8.2* 7.8*    Liver Function Tests:  Recent Labs Lab 04/04/16 0438  AST 24  ALT 9*  ALKPHOS 106  BILITOT 0.8  PROT 7.0  ALBUMIN 3.1*   No results for input(s): LIPASE, AMYLASE in the last 168 hours. No results for input(s): AMMONIA in the last 168 hours.  CBC:  Recent Labs Lab 04/04/16 0438 04/04/16 0938 04/04/16 1511 04/04/16 2209 04/05/16 0511  WBC 5.9  --   --   --  4.0  HGB 9.3* 7.9* 7.0* 8.4* 8.5*  HCT 28.4*  --   --   --  26.7*  MCV 90.4  --   --   --  91.3  PLT 352  --   --   --  300    Cardiac Enzymes: No results for input(s): CKTOTAL, CKMB,  CKMBINDEX, TROPONINI in the last 168 hours.  BNP: Invalid input(s): POCBNP  CBG:  Recent Labs Lab 04/04/16 2157 04/05/16 0724 04/05/16 1121  GLUCAP 184* 238* 160*    Microbiology: Results for orders placed or performed during the hospital encounter of 04/04/16  MRSA PCR Screening     Status: None   Collection Time: 04/04/16  6:40 PM  Result Value Ref Range Status   MRSA by PCR NEGATIVE NEGATIVE Final    Comment:        The GeneXpert MRSA Assay (FDA approved for NASAL specimens only), is one component of a comprehensive MRSA colonization surveillance program. It is not intended to diagnose MRSA infection nor to guide or monitor treatment for MRSA infections.     Coagulation Studies:  Recent Labs  04/04/16 0500  LABPROT 16.0*  INR 1.27    Urinalysis: No results for input(s): COLORURINE, LABSPEC, PHURINE, GLUCOSEU, HGBUR, BILIRUBINUR, KETONESUR, PROTEINUR, UROBILINOGEN, NITRITE, LEUKOCYTESUR in the last 72 hours.  Invalid input(s): APPERANCEUR    Imaging: No results found.   Medications:    . amLODipine  10 mg Oral Daily  . [START ON 04/06/2016] epoetin (EPOGEN/PROCRIT) injection  10,000 Units Intravenous Q T,Th,Sa-HD  . insulin pump  Subcutaneous TID AC, HS, 0200  . labetalol  200 mg Oral TID  . LamoTRIgine  300 mg Oral QHS  . loratadine  10 mg Oral QHS  . pantoprazole (PROTONIX) IV  40 mg Intravenous Q12H  . sevelamer carbonate  1,600 mg Oral TID WC  . sodium chloride flush  3 mL Intravenous Q12H  . tacrolimus  3 mg Oral BID   sodium chloride, acetaminophen **OR** acetaminophen, HYDROcodone-acetaminophen, ondansetron **OR** ondansetron (ZOFRAN) IV, oxyCODONE, polyethylene glycol, promethazine, sodium chloride flush  Assessment/ Plan:  Ms. [name] is a 41 y.o. 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  [facility] [place]. [place], [place]  1. End Stage Renal Disease with hyperkalemia: on admission. Hemodialysis yesterday. Tolerated treatment well. Next treatment for Saturday - Discussed low potassium diet - Continue immunosuppression: tacrolimus  2. Hypertension:   - labetalol, amlodipine - not on an ACE-I/ARB due to history of hyperkalemia.   3. Anemia of chronic kidney disease: with GI bleed. Similar presentation [facility] on 03[facility]. Endoscopy, CTA and tagged RBC scan at that time did not reveal source.  - Getting PO iron supplements and IV iron from hematology. Unable to get IV iron at hemodialysis since she states she is allergic to it.  - epo with dialysis treatment  4. Secondary Hyperparathyroidism:  Previously on Auryxia as binder which also is an iron supplement - Continue to sevelamer   LOS: 0 Jaleisa Brose 1/19/201811:37 AM

## 2016-04-05 NOTE — Progress Notes (Signed)
Inpatient Diabetes Program Recommendations  AACE/ADA: New Consensus Statement on Inpatient Glycemic Control (2015)  Target Ranges:  Prepandial:   less than 140 mg/dL      Peak postprandial:   less than 180 mg/dL (1-2 hours)      Critically ill patients:  140 - 180 mg/dL   Lab Results  Component Value Date   GLUCAP 238 (H) 04/05/2016   HGBA1C 5.3 07/06/2014    Review of Glycemic Control  Results for [name], [name] (MRN [id number]) as of 04/05/2016 10:53  Ref. Range 04/04/2016 21:57 04/05/2016 07:24  Glucose-Capillary Latest Ref Range: 65 - 99 mg/dL 782184 (H) 956238 (H)    Diabetes history: Type 1  Outpatient Diabetes medications: Novolog insulin in her insulin pump; basal rate 0.625 units/hour (total daily basal rate 15 units/day), insulin to carb ratio- 1 unit/30 grams of carbohydrate, 1 unit drops her 70 points.   Current orders for Inpatient glycemic control: Novolog insulin via insulin pump.   Inpatient Diabetes Program Recommendations:  Patient has her supplies at her bedside- she has signed the pump consent and has agree to use hospital CBG to control blood sugars.   Allow her to continue to use her pump as long as she is physically and psychologically able to manage it.   Susette RacerJulie Ziair Penson, RN, BA, MHA, CDE Diabetes Coordinator Inpatient Diabetes Program  [phone] (Team Pager) [phone] [facility] Office) 04/05/2016 1:32 PM

## 2016-04-07 ENCOUNTER — Encounter: Payer: Self-pay | Admitting: Emergency Medicine

## 2016-04-07 ENCOUNTER — Inpatient Hospital Stay
Admission: EM | Admit: 2016-04-07 | Discharge: 2016-04-08 | DRG: 377 | Disposition: A | Discharge status: Other Acute Inpatient Hospital | Payer: Medicare Other | Attending: Family Medicine | Admitting: Family Medicine

## 2016-04-07 DIAGNOSIS — Z886 Allergy status to analgesic agent status: Secondary | ICD-10-CM | POA: Diagnosis not present

## 2016-04-07 DIAGNOSIS — Z881 Allergy status to other antibiotic agents status: Secondary | ICD-10-CM | POA: Diagnosis not present

## 2016-04-07 DIAGNOSIS — F419 Anxiety disorder, unspecified: Secondary | ICD-10-CM | POA: Diagnosis present

## 2016-04-07 DIAGNOSIS — Z794 Long term (current) use of insulin: Secondary | ICD-10-CM

## 2016-04-07 DIAGNOSIS — Z841 Family history of disorders of kidney and ureter: Secondary | ICD-10-CM | POA: Diagnosis not present

## 2016-04-07 DIAGNOSIS — K922 Gastrointestinal hemorrhage, unspecified: Secondary | ICD-10-CM | POA: Diagnosis present

## 2016-04-07 DIAGNOSIS — R578 Other shock: Secondary | ICD-10-CM | POA: Diagnosis present

## 2016-04-07 DIAGNOSIS — N186 End stage renal disease: Secondary | ICD-10-CM | POA: Diagnosis present

## 2016-04-07 DIAGNOSIS — Z79899 Other long term (current) drug therapy: Secondary | ICD-10-CM

## 2016-04-07 DIAGNOSIS — D62 Acute posthemorrhagic anemia: Secondary | ICD-10-CM | POA: Diagnosis present

## 2016-04-07 DIAGNOSIS — Z91040 Latex allergy status: Secondary | ICD-10-CM

## 2016-04-07 DIAGNOSIS — T8612 Kidney transplant failure: Secondary | ICD-10-CM | POA: Diagnosis present

## 2016-04-07 DIAGNOSIS — Z9641 Presence of insulin pump (external) (internal): Secondary | ICD-10-CM | POA: Diagnosis present

## 2016-04-07 DIAGNOSIS — R112 Nausea with vomiting, unspecified: Secondary | ICD-10-CM | POA: Diagnosis present

## 2016-04-07 DIAGNOSIS — R000 Tachycardia, unspecified: Secondary | ICD-10-CM | POA: Diagnosis present

## 2016-04-07 DIAGNOSIS — I120 Hypertensive chronic kidney disease with stage 5 chronic kidney disease or end stage renal disease: Secondary | ICD-10-CM | POA: Diagnosis present

## 2016-04-07 DIAGNOSIS — Z992 Dependence on renal dialysis: Secondary | ICD-10-CM | POA: Diagnosis not present

## 2016-04-07 DIAGNOSIS — Y830 Surgical operation with transplant of whole organ as the cause of abnormal reaction of the patient, or of later complication, without mention of misadventure at the time of the procedure: Secondary | ICD-10-CM | POA: Diagnosis present

## 2016-04-07 DIAGNOSIS — Z888 Allergy status to other drugs, medicaments and biological substances status: Secondary | ICD-10-CM | POA: Diagnosis not present

## 2016-04-07 DIAGNOSIS — G40909 Epilepsy, unspecified, not intractable, without status epilepticus: Secondary | ICD-10-CM | POA: Diagnosis present

## 2016-04-07 DIAGNOSIS — E875 Hyperkalemia: Secondary | ICD-10-CM | POA: Diagnosis present

## 2016-04-07 DIAGNOSIS — Z8249 Family history of ischemic heart disease and other diseases of the circulatory system: Secondary | ICD-10-CM

## 2016-04-07 DIAGNOSIS — E1022 Type 1 diabetes mellitus with diabetic chronic kidney disease: Secondary | ICD-10-CM | POA: Diagnosis present

## 2016-04-07 DIAGNOSIS — Z833 Family history of diabetes mellitus: Secondary | ICD-10-CM

## 2016-04-07 DIAGNOSIS — R58 Hemorrhage, not elsewhere classified: Secondary | ICD-10-CM

## 2016-04-07 DIAGNOSIS — K625 Hemorrhage of anus and rectum: Secondary | ICD-10-CM | POA: Diagnosis present

## 2016-04-07 DIAGNOSIS — Z452 Encounter for adjustment and management of vascular access device: Secondary | ICD-10-CM

## 2016-04-07 LAB — CBC
HCT: 16.5 % — ABNORMAL LOW (ref 35.0–47.0)
HEMOGLOBIN: 5.3 g/dL — AB (ref 12.0–16.0)
MCH: 30.5 pg (ref 26.0–34.0)
MCHC: 32.1 g/dL (ref 32.0–36.0)
MCV: 95.2 fL (ref 80.0–100.0)
Platelets: 317 10*3/uL (ref 150–440)
RBC: 1.73 MIL/uL — ABNORMAL LOW (ref 3.80–5.20)
RDW: 24.4 % — ABNORMAL HIGH (ref 11.5–14.5)
WBC: 6.7 10*3/uL (ref 3.6–11.0)

## 2016-04-07 LAB — COMPREHENSIVE METABOLIC PANEL
ALT: 7 U/L — ABNORMAL LOW (ref 14–54)
ANION GAP: 7 (ref 5–15)
AST: 20 U/L (ref 15–41)
Albumin: 2.6 g/dL — ABNORMAL LOW (ref 3.5–5.0)
Alkaline Phosphatase: 65 U/L (ref 38–126)
BILIRUBIN TOTAL: 0.5 mg/dL (ref 0.3–1.2)
BUN: 26 mg/dL — AB (ref 6–20)
CO2: 26 mmol/L (ref 22–32)
Calcium: 7.5 mg/dL — ABNORMAL LOW (ref 8.9–10.3)
Chloride: 104 mmol/L (ref 101–111)
Creatinine, Ser: 4.37 mg/dL — ABNORMAL HIGH (ref 0.44–1.00)
GFR calc Af Amer: 14 mL/min — ABNORMAL LOW (ref >60)
GFR, EST NON AFRICAN AMERICAN: 12 mL/min — AB (ref >60)
Glucose, Bld: 115 mg/dL — ABNORMAL HIGH (ref 65–99)
POTASSIUM: 5.6 mmol/L — AB (ref 3.5–5.1)
Sodium: 137 mmol/L (ref 135–145)
TOTAL PROTEIN: 5.4 g/dL — AB (ref 6.5–8.1)

## 2016-04-07 LAB — GLUCOSE, CAPILLARY
GLUCOSE-CAPILLARY: 149 mg/dL — AB (ref 65–99)
Glucose-Capillary: 96 mg/dL (ref 65–99)

## 2016-04-07 LAB — PREPARE RBC (CROSSMATCH)

## 2016-04-07 MED ORDER — MORPHINE SULFATE (PF) 2 MG/ML IV SOLN
2.0000 mg | Freq: Once | INTRAVENOUS | Status: DC
  Filled 2016-04-07: qty 1

## 2016-04-07 MED ORDER — SODIUM CHLORIDE 0.9 % IV SOLN: 10.0000 mL/h | Freq: Once | INTRAVENOUS | Status: DC

## 2016-04-07 MED ORDER — LORAZEPAM 2 MG/ML IJ SOLN
INTRAMUSCULAR | Status: AC
  Filled 2016-04-07: qty 1

## 2016-04-07 MED ORDER — HYDROMORPHONE HCL 1 MG/ML IJ SOLN
0.2500 mg | Freq: Once | INTRAMUSCULAR | Status: AC
  Administered 2016-04-07: 0.25 mg via INTRAVENOUS
  Filled 2016-04-07: qty 1

## 2016-04-07 MED ORDER — SODIUM CHLORIDE 0.9 % IV SOLN
10.0000 mL/h | Freq: Once | INTRAVENOUS | Status: AC
  Administered 2016-04-07: 10 mL/h via INTRAVENOUS

## 2016-04-07 NOTE — ED Triage Notes (Signed)
Pt presents to ED via EMS from home c/o recurrent GI bleeding for past few days

## 2016-04-07 NOTE — ED Notes (Signed)
Insulin pump to right lower quad

## 2016-04-08 ENCOUNTER — Inpatient Hospital Stay: Payer: Medicare Other

## 2016-04-08 LAB — HEMOGLOBIN AND HEMATOCRIT, BLOOD
HCT: 27.1 % — ABNORMAL LOW (ref 35.0–47.0)
HEMOGLOBIN: 9.2 g/dL — AB (ref 12.0–16.0)

## 2016-04-08 LAB — BASIC METABOLIC PANEL
ANION GAP: 6 (ref 5–15)
BUN: 25 mg/dL — ABNORMAL HIGH (ref 6–20)
CO2: 19 mmol/L — AB (ref 22–32)
Calcium: 5.5 mg/dL — CL (ref 8.9–10.3)
Chloride: 112 mmol/L — ABNORMAL HIGH (ref 101–111)
Creatinine, Ser: 3.94 mg/dL — ABNORMAL HIGH (ref 0.44–1.00)
GFR calc Af Amer: 15 mL/min — ABNORMAL LOW (ref >60)
GFR calc non Af Amer: 13 mL/min — ABNORMAL LOW (ref >60)
GLUCOSE: 284 mg/dL — AB (ref 65–99)
POTASSIUM: 6.3 mmol/L — AB (ref 3.5–5.1)
Sodium: 137 mmol/L (ref 135–145)

## 2016-04-08 LAB — GLUCOSE, CAPILLARY: Glucose-Capillary: 413 mg/dL — ABNORMAL HIGH (ref 65–99)

## 2016-04-08 MED ORDER — HYDROMORPHONE HCL 1 MG/ML IJ SOLN
0.2500 mg | Freq: Once | INTRAMUSCULAR | Status: AC
  Administered 2016-04-08: 0.25 mg via INTRAVENOUS
  Filled 2016-04-08: qty 1

## 2016-04-08 MED ORDER — SODIUM CHLORIDE 0.9 % IV SOLN
1.0000 g | Freq: Once | INTRAVENOUS | Status: AC
  Administered 2016-04-08: 1 g via INTRAVENOUS

## 2016-04-08 MED ORDER — ONDANSETRON HCL 4 MG/2ML IJ SOLN
4.0000 mg | Freq: Once | INTRAMUSCULAR | Status: AC
  Administered 2016-04-08: 4 mg via INTRAVENOUS

## 2016-04-08 MED ORDER — DEXTROSE 50 % IV SOLN
1.0000 | Freq: Once | INTRAVENOUS | Status: AC
  Administered 2016-04-08: 50 mL via INTRAVENOUS
  Filled 2016-04-08: qty 50

## 2016-04-08 MED ORDER — ONDANSETRON HCL 4 MG/2ML IJ SOLN
INTRAMUSCULAR | Status: AC
  Administered 2016-04-08: 4 mg via INTRAVENOUS
  Filled 2016-04-08: qty 2

## 2016-04-08 MED ORDER — SODIUM CHLORIDE 0.9 % IV SOLN: Freq: Once | INTRAVENOUS | Status: DC

## 2016-04-08 MED ORDER — SODIUM CHLORIDE 0.9 % IV SOLN: 10.0000 mL/h | Freq: Once | INTRAVENOUS | Status: DC

## 2016-04-08 MED ORDER — SODIUM BICARBONATE 8.4 % IV SOLN
50.0000 meq | Freq: Once | INTRAVENOUS | Status: AC
  Administered 2016-04-08: 50 meq via INTRAVENOUS

## 2016-04-08 MED ORDER — INSULIN ASPART 100 UNIT/ML ~~LOC~~ SOLN
5.0000 [IU] | Freq: Once | SUBCUTANEOUS | Status: AC
  Administered 2016-04-08: 5 [IU] via INTRAVENOUS
  Filled 2016-04-08: qty 5

## 2016-04-08 MED ORDER — ALBUTEROL SULFATE (2.5 MG/3ML) 0.083% IN NEBU
2.5000 mg | INHALATION_SOLUTION | Freq: Once | RESPIRATORY_TRACT | Status: AC
  Administered 2016-04-08: 2.5 mg via RESPIRATORY_TRACT
  Filled 2016-04-08: qty 3

## 2016-04-08 MED ORDER — LIDOCAINE HCL 2 % EX GEL
1.0000 "application " | Freq: Once | CUTANEOUS | Status: AC
  Administered 2016-04-08: 1

## 2016-04-08 MED ORDER — CALCIUM GLUCONATE 10 % IV SOLN
INTRAVENOUS | Status: AC
  Filled 2016-04-08: qty 10

## 2016-04-08 NOTE — Code Documentation (Signed)
4th unit of RBC infusing

## 2016-04-08 NOTE — ED Notes (Signed)
Patient was rapid infused 5 units of blood and 2 units of FFP by previous shift nurse Darvin NeighboursShannin Hatch, RN.  Pt currently has 1 unit of blood and 1 unit of FFP hanging.  Per Dr. Darnelle CatalanMalinda, these are to be transfused over 2 hours each.  Per previous nurse, some bags of blood unable to be scanned.

## 2016-04-08 NOTE — Code Documentation (Signed)
Family at bedside to get update; escorted by officer

## 2016-04-08 NOTE — Code Documentation (Signed)
5 unit of blood RBC infusing RAC with pressure bag

## 2016-04-08 NOTE — Code Documentation (Signed)
Pt with 6th unit of RBC and 3rd Unit FFP infusing. Verbal report to next shift nurse Denzil MagnusonIris, RN

## 2016-04-08 NOTE — H&P (Signed)
History and Physical   [facility] - [place] @ [facility] Admission History and Physical [employer], D.O.    Patient Name: [name] MR#: [id number] Date of Birth: [birth year] Date of Admission: 04/07/2016  Referring MD/NP/PA: Dr. Darnelle Catalan Primary Care Physician: Elizabeth Palau, FNP  Patient coming from: Home  Chief Complaint: Rectal bleeding  HPI: [name] is a 41 y.o. female with a known history of ESRD s/p failed renal transplant, DM1 was in a usual state of health until December 2017 when she was admitted at [facility] for rectal bleeding. CTA and colonoscopy were unrevealing. She was discharged home with outpatient follow-up. She was admitted to this facility 4 days ago with similar symptoms. Her bleeding stopped and hemoglobin stabilized therefore she was again discontinued home. Today she returns to the emergency department with recurrent bright red blood per rectum. Emergency department physician discussed with GI to plan to admit for bleeding scan and consideration of repeat scope in a.m.Marland Kitchen   ED Course: Patient was found to have a hemoglobin of 5.3 and transfusion of 2 units of packed red blood cells was initiated in the emergency department. However patient became acutely unstable while in the emergency department. She put out about 1-2 L of bright red blood per rectum, became hypotensive and nearly lost consciousness. She was resuscitated by the emergency department staff including aggressive IV fluid, 6 units of packed red blood cells 2 units of FFP. Her vital signs did stabilize and her bleeding stopped for the time being. At this time I transferred the patient's care to the intensivist. E Link accepted the patient to intensive care.  Review of Systems:  Unable to assess secondary to patient's mental status and critical condition.  Past Medical History:  Diagnosis Date  . Allergy   . Anemia   . Anxiety   . Blood transfusion without  reported diagnosis   . Cataract   . Diabetes mellitus without complication (HCC)    history of/had transplant/tn  . ESRD on dialysis [facility]) 04/08/2011   Kidney transplant  . Heart murmur   . Hypertension   . Seizures (HCC)     Past Surgical History:  Procedure Laterality Date  . catheter surgery for dialysis  2011  . catheter surgery for dialysis  2012  . COMBINED KIDNEY-PANCREAS TRANSPLANT  2013  . ELBOW SURGERY  2009  . FEMUR SURGERY  2001   rod removed  . left fem  1999   rod in left femur  . REMOVAL OF A DIALYSIS CATHETER  2012   removed due to infection/tn  . ulna surgery  1999   plate put in arm  . WRIST SURGERY  2006   right wrist broken     reports that she has never smoked. She has never used smokeless tobacco. She reports that she does not drink alcohol or use drugs.  Allergies  Allergen Reactions  . Clarithromycin Rash    Can not take due to transplant  . Enoxaparin Sodium Rash  . Entex T [Pseudoephedrine-Guaifenesin] Rash  . Levetiracetam Other (See Comments)    Constant Migraine  . Quinine Derivatives Rash    vomitting  . Ciprofloxacin Other (See Comments)    tingling  . Keppra [Levetiracetam] Other (See Comments)    Headache  . Latex     ONLY CONDOMS  . Naproxen Sodium Other (See Comments)    Ate lining of stomach/ Can not take due to transplant/tn  . Nsaids     Can  not take due to kidney transplant/tn  . Scopolamine Rash    Family History  Problem Relation Age of Onset  . Heart disease Father   . Diabetes Father   . Hypertension Maternal Grandmother   . Stroke Maternal Grandmother   . Hypertension Maternal Grandfather   . Heart disease Paternal Grandmother   . Hypertension Paternal Grandmother   . Hypertension Paternal Grandfather   . Thyroid disease Mother   . Hypertension Mother   . Kidney disease Mother   . Cancer Maternal Aunt     Prior to Admission medications   Medication Sig Start Date End Date Taking? Authorizing Provider   acetaminophen (TYLENOL) 500 MG tablet Take 500 mg by mouth every 6 (six) hours as needed.   Yes Historical Provider, MD  amLODipine (NORVASC) 10 MG tablet Take 10 mg by mouth daily.   Yes Historical Provider, MD  cetirizine (ZYRTEC ALLERGY) 10 MG tablet Take 10 mg by mouth daily.   Yes Historical Provider, MD  epoetin alfa (EPOGEN,PROCRIT) 16109 UNIT/ML injection Inject 10,000 Units into the vein 3 (three) times a week. During dialysis on Tues, thurs, sat   Yes Historical Provider, MD  labetalol (NORMODYNE) 200 MG tablet Take 1 tablet by mouth 3 (three) times daily. 01/29/15  Yes Historical Provider, MD  LamoTRIgine (LAMICTAL XR) 100 MG TB24 Take 300 mg by mouth daily.   Yes Historical Provider, MD  levonorgestrel (MIRENA) 20 MCG/24HR IUD 1 Intra Uterine Device (1 each total) by Intrauterine route once. Will place in clinic 03/03/12  Yes Reva Bores, MD  meclizine (ANTIVERT) 25 MG tablet Take 1 tablet by mouth 2 (two) times daily. 04/03/16  Yes Historical Provider, MD  NOVOLOG 100 UNIT/ML injection Inject 0-30 Units into the skin daily. Via insulin pump   Yes Historical Provider, MD  omeprazole (PRILOSEC) 20 MG capsule Take 20 mg by mouth daily.   Yes Historical Provider, MD  ondansetron (ZOFRAN) 4 MG tablet Take 4 mg by mouth every 8 (eight) hours as needed.   Yes Historical Provider, MD  oxyCODONE (OXY IR/ROXICODONE) 5 MG immediate release tablet Take 5 mg by mouth every 4 (four) hours as needed for severe pain.   Yes Historical Provider, MD  promethazine (PHENERGAN) 12.5 MG tablet Take 12.5 mg by mouth.   Yes Historical Provider, MD  rizatriptan (MAXALT) 10 MG tablet Take 10 mg by mouth as needed. May repeat in 2 hours if needed   Yes Historical Provider, MD  sevelamer carbonate (RENVELA) 800 MG tablet Take 1,600 mg by mouth 3 (three) times daily with meals.   Yes Historical Provider, MD  tacrolimus (PROGRAF) 1 MG capsule Take 3 mg by mouth 2 (two) times daily.    Yes Historical Provider, MD     Physical Exam: Vitals:   04/08/16 0115 04/08/16 0116 04/08/16 0118 04/08/16 0120  BP:  116/78 115/78 130/73  Pulse: 73 74 72 74  Resp: (!) 7 (!) 6  12  Temp:      TempSrc:      SpO2: 100% 100% 100% 100%  Weight:      Height:        GENERAL: 41 y.o.-year-old Ill-appearing, pale female patient, well-developed, well-nourished lying in the bed in no acute distress.  Pleasant and cooperative.   HEENT: Head atraumatic, normocephalic. Pupils equal, round, reactive to light and accommodation. No scleral icterus. Extraocular muscles intact. Nares are patent. Oropharynx is clear. Mucus membranes dry. NECK: Supple, full range of motion. No JVD, no bruit  heard. No thyroid enlargement, no tenderness, no cervical lymphadenopathy. CHEST: Normal breath sounds bilaterally. No wheezing, rales, rhonchi or crackles. No use of accessory muscles of respiration.  No reproducible chest wall tenderness.  CARDIOVASCULAR: S1, S2 normal. Tachycardic No murmurs, rubs, or gallops. Cap refill <2 seconds. Pulses intact distally.  ABDOMEN: Soft, nondistended, nontender. No rebound, guarding, rigidity. Normoactive bowel sounds present in all four quadrants. No organomegaly or mass. EXTREMITIES: No pedal edema, cyanosis, or clubbing. No calf tenderness or Homan's sign. There is mild pedal edema bilaterally with a well-healed wound in the right anterior tibia. NEUROLOGIC: The patient is alert and oriented x 3. No gross osensory motor deficit SKIN: Warm, dry, and intact without obvious rash, lesion, or ulcer.    Labs on Admission:  CBC:  Recent Labs Lab 04/04/16 0438  04/04/16 2209 04/05/16 0511 04/05/16 1412 04/07/16 1933 04/08/16 0043  WBC 5.9  --   --  4.0  --  6.7  --   HGB 9.3*  < > 8.4* 8.5* 8.5* 5.3* 9.2*  HCT 28.4*  --   --  26.7*  --  16.5* 27.1*  MCV 90.4  --   --  91.3  --  95.2  --   PLT 352  --   --  300  --  317  --   < > = values in this interval not displayed. Basic Metabolic  Panel:  Recent Labs Lab 04/04/16 0438 04/05/16 0511 04/07/16 1933 04/08/16 0043  NA 136 139 137 137  K 5.5* 4.5 5.6* 6.3*  CL 103 100* 104 112*  CO2 23 30 26  19*  GLUCOSE 205* 221* 115* 284*  BUN 39* 20 26* 25*  CREATININE 5.38* 3.57* 4.37* 3.94*  CALCIUM 8.2* 7.8* 7.5* 5.5*   GFR: Estimated Creatinine Clearance: 16.4 mL/min (by C-G formula based on SCr of 3.94 mg/dL (H)). Liver Function Tests:  Recent Labs Lab 04/04/16 0438 04/07/16 1933  AST 24 20  ALT 9* 7*  ALKPHOS 106 65  BILITOT 0.8 0.5  PROT 7.0 5.4*  ALBUMIN 3.1* 2.6*   No results for input(s): LIPASE, AMYLASE in the last 168 hours. No results for input(s): AMMONIA in the last 168 hours. Coagulation Profile:  Recent Labs Lab 04/04/16 0500  INR 1.27   Cardiac Enzymes: No results for input(s): CKTOTAL, CKMB, CKMBINDEX, TROPONINI in the last 168 hours. BNP (last 3 results) No results for input(s): PROBNP in the last 8760 hours. HbA1C: No results for input(s): HGBA1C in the last 72 hours. CBG:  Recent Labs Lab 04/05/16 0724 04/05/16 1121 04/05/16 1626 04/07/16 2205 04/07/16 2258  GLUCAP 238* 160* 103* 96 149*   Lipid Profile: No results for input(s): CHOL, HDL, LDLCALC, TRIG, CHOLHDL, LDLDIRECT in the last 72 hours. Thyroid Function Tests: No results for input(s): TSH, T4TOTAL, FREET4, T3FREE, THYROIDAB in the last 72 hours. Anemia Panel: No results for input(s): VITAMINB12, FOLATE, FERRITIN, TIBC, IRON, RETICCTPCT in the last 72 hours. Urine analysis:    Component Value Date/Time   LABSPEC >=1.030 02/02/2007 0810   PHURINE 6.0 02/02/2007 0810   GLUCOSEU 500 (A) 02/02/2007 0810   HGBUR TRACE (A) 02/02/2007 0810   BILIRUBINUR SMALL (A) 02/02/2007 0810   KETONESUR 15 (A) 02/02/2007 0810   PROTEINUR >=300 (A) 02/02/2007 0810   UROBILINOGEN 2.0 (H) 02/02/2007 0810   NITRITE NEGATIVE 02/02/2007 0810   LEUKOCYTESUR  02/02/2007 0810    NEGATIVE  Biochemical Testing Only. Please order routine  urinalysis from main lab if confirmatory testing is needed.  Sepsis Labs: @LABRCNTIP (procalcitonin:4,lacticidven:4) ) Recent Results (from the past 240 hour(s))  MRSA PCR Screening     Status: None   Collection Time: 04/04/16  6:40 PM  Result Value Ref Range Status   MRSA by PCR NEGATIVE NEGATIVE Final    Comment:        The GeneXpert MRSA Assay (FDA approved for NASAL specimens only), is one component of a comprehensive MRSA colonization surveillance program. It is not intended to diagnose MRSA infection nor to guide or monitor treatment for MRSA infections.      Radiological Exams on Admission: Dg Chest Portable 1 View  Result Date: 04/08/2016 CLINICAL DATA:  41 year old female with recurrent GI bleed. EXAM: PORTABLE CHEST 1 VIEW COMPARISON:  Chest radiograph dated 07/03/2014 FINDINGS: The patient is slightly tilted to the left. There is a left IJ dialysis catheter with tip over the right cardiac silhouette. There is mild cardiomegaly. There is asymmetric left lung interstitial prominence, likely atelectatic changes. No focal consolidation, pleural effusion, or pneumothorax. No acute osseous pathology. IMPRESSION: Mild cardiomegaly. Mild asymmetric left lung diffuse interstitial prominence, likely atelectatic changes. No focal consolidation. Electronically Signed   By: Elgie Collard M.D.   On: 04/08/2016 01:33   Assessment/Plan Active Problems:   GI bleed    This is a 41 y.o. female with a history of End-stage renal disease on dialysis status post failed renal transplant, DM 1, hypertension, seizures now being admitted with:  1. Hemorrhagic shock secondary to lower GI bleed -Admit to ICU with telemetry monitoring - Aggressively fluid resuscitate -Serial CBCs and transfuse as needed -Monitor electrolytes closely -IV Protonix -GI was contacted by the emergency department  -Critical care consultation was placed. Discussed the case with E Link and critical care NP. Care  was transferred to intensivists.  2. ESRD on HD -Nephrology consultation for hemodialysis  Admission status: Inpatient, ICU IV Fluids: Normal saline Diet/Nutrition: Nothing by mouth Consults called: GI, nephrology  DVT Px:  SCDs and early ambulation. Chemoprophylaxis will be contraindicated at this time secondary to active GI bleed Code Status: Full Code  Disposition Plan: To home in 3-4 days   All the records are reviewed and case discussed with ED provider. Management plans discussed with the patient and/or family who express understanding and agree with plan of care.  Cherron Blitzer D.O. on 04/08/2016 at 2:02 AM Between 7am to 6pm - Pager - [phone] After 6pm go to [email or web address] - Social research officer, government [facility] [place] [facility] Office [phone] CC: Primary care physician; Elizabeth Palau, FNP   04/08/2016, 2:02 AM

## 2016-04-08 NOTE — Code Documentation (Signed)
Pt estimated bled 2-3 L prior to infusion; with intermittent clotting during infusions. Pt is verbal, alert and  oriented.

## 2016-04-08 NOTE — ED Notes (Signed)
Pt states she had turned insulin pump off, informed that her glucose on the last blood work was 284. Pt states she will turn pump back on.

## 2016-04-08 NOTE — Consult Note (Signed)
PULMONARY / CRITICAL CARE MEDICINE   Name: [name] MRN: [id number] DOB: 1975/05/31    ADMISSION DATE:  04/07/2016   CONSULTATION DATE:  04/08/2016  REFERRING MD:  Dr. 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 Her hemoglobin upon ED arrival was 5.3 g/dl. She was given 2 units of packed red blood cells. However, she suddenly became hypotensive and had multiple Episodes of rectal bleeding, losing approximately 2-3 L of blood per rectum. She has received a total time of 2 units of fresh frozen plasma and 5 units of packed red blood cells. Her blood pressure stable, but she is tachycardic with heart rates in the 140s. Her repeat labs show a potassium level of 6.3. She is complaining of, dizziness, nausea and vomiting. PCCM was going to manage the patient in the ICU. However, given her rapidly declining status, the emergency room has decided to [facility] evaluation and treatment. Of note, patient had a similar episode in December 2017 and was [facility]. She had a CTA and a colonoscopy, both of which were unremarkable. She was discharged after the bleeding subsided. She then returned to the [facility] with similar symptoms again on April 04, 2016. She was transfused and stabilized and discharged home on 04/05/2016.  PAST MEDICAL HISTORY :  She  has a past medical history of Allergy; Anemia; Anxiety; Blood transfusion without reported diagnosis; Cataract; Diabetes mellitus without complication (HCC); ESRD on dialysis (HCC) (04/08/2011); Heart murmur; Hypertension; and Seizures (HCC).  PAST SURGICAL HISTORY: She  has a past surgical history  that includes Combined kidney-pancreas transplant (2013); left fem (1999); ulna surgery (1999); Femur Surgery (2001); Wrist surgery (2006); Elbow surgery (2009); catheter surgery for dialysis (2011); Removal of a dialysis catheter (2012); and catheter surgery for dialysis (2012).  Allergies  Allergen Reactions  . Clarithromycin Rash    Can not take due to transplant  . Enoxaparin Sodium Rash  . Entex T [Pseudoephedrine-Guaifenesin] Rash  . Levetiracetam Other (See Comments)    Constant Migraine  . Quinine Derivatives Rash    vomitting  . Ciprofloxacin Other (See Comments)    tingling  . Keppra [Levetiracetam] Other (See Comments)    Headache  . Latex     ONLY CONDOMS  . Naproxen Sodium Other (See Comments)    Ate lining of stomach/ Can not take due to transplant/tn  . Nsaids     Can not take due to kidney transplant/tn  . Scopolamine Rash    No current facility-administered medications on file prior to encounter.    Current Outpatient Prescriptions on File Prior to Encounter  Medication Sig  . acetaminophen (TYLENOL) 500 MG tablet Take 500 mg by mouth every 6 (six) hours as needed.  Marland Kitchen. amLODipine (NORVASC) 10 MG tablet Take 10 mg by mouth daily.  . cetirizine (ZYRTEC ALLERGY) 10 MG tablet Take 10 mg by mouth daily.  Marland Kitchen. labetalol (NORMODYNE) 200 MG tablet Take 1 tablet by mouth 3 (three) times daily.  . LamoTRIgine (LAMICTAL XR) 100 MG TB24 Take 300 mg by mouth daily.  Marland Kitchen. levonorgestrel (MIRENA) 20 MCG/24HR IUD 1 Intra Uterine Device (1 each total) by Intrauterine route once. Will place  in clinic  . NOVOLOG 100 UNIT/ML injection Inject 0-30 Units into the skin daily. Via insulin pump  . omeprazole (PRILOSEC) 20 MG capsule Take 20 mg by mouth daily.  . ondansetron (ZOFRAN) 4 MG tablet Take 4 mg by mouth every 8 (eight) hours as needed.  Marland Kitchen oxyCODONE (OXY IR/ROXICODONE) 5 MG immediate release tablet Take 5 mg by mouth every 4 (four) hours as needed for severe pain.  . promethazine  (PHENERGAN) 12.5 MG tablet Take 12.5 mg by mouth.  . rizatriptan (MAXALT) 10 MG tablet Take 10 mg by mouth as needed. May repeat in 2 hours if needed  . sevelamer carbonate (RENVELA) 800 MG tablet Take 1,600 mg by mouth 3 (three) times daily with meals.  . tacrolimus (PROGRAF) 1 MG capsule Take 3 mg by mouth 2 (two) times daily.     FAMILY HISTORY:  Her indicated that the status of her mother is unknown. She indicated that the status of her father is unknown. She indicated that the status of her maternal grandmother is unknown. She indicated that the status of her maternal grandfather is unknown. She indicated that the status of her paternal grandmother is unknown. She indicated that the status of her paternal grandfather is unknown. She indicated that the status of her maternal aunt is unknown.    SOCIAL HISTORY: She  reports that she has never smoked. She has never used smokeless tobacco. She reports that she does not drink alcohol or use drugs.  REVIEW OF SYSTEMS:   Constitutional: Negative for fever and chills.  HENT: Negative for congestion and rhinorrhea.  Eyes: Negative for redness and visual disturbance.  Respiratory: Negative for shortness of breath and wheezing.  Cardiovascular: Negative for chest pain and palpitations.  Gastrointestinal: positive  for nausea , vomiting and bright red blood per rectum Genitourinary: Negative for dysuria and urgency.  Endocrine: Denies polyuria, polyphagia and heat intolerance. Uses insulin pump Musculoskeletal: Negative for myalgias and arthralgias.  Skin: Negative for pallor and wound.  Neurological: positive for dizziness and headaches   SUBJECTIVE:   VITAL SIGNS: BP 130/73   Pulse 74   Temp 98.5 F (36.9 C) (Oral)   Resp 12   Ht 5\' 4"  (1.626 m)   Wt 55.8 kg (123 lb)   SpO2 100%   BMI 21.11 kg/m   HEMODYNAMICS:    VENTILATOR SETTINGS:    INTAKE / OUTPUT: No intake/output data recorded.  PHYSICAL EXAMINATION: General:  Acutely ill-looking, older for age Neuro: Alert and oriented 3, follows, needs, no focal deficits HEENT: [place]/AT, PERRLA, conjunctivae pale, trachea midline, oral mucosa dry Cardiovascular: Apical pulse tachycardic, regular, S1, S2 audible, no murmur, regurg or gallop, +2 pulses bilaterally Lungs: clear to auscultation bilaterally without any wheezing or rhonchi Abdomen:  Nondistended, normal bowel sounds in all 4 quadrants Genitourinary: No visible bright red blood per rectum with multiple clots Musculoskeletal:  No joint deformities, positive range of motion in upper and lower extremities Skin: Pale, cold to touch   LABS:  BMET  Recent Labs Lab 04/05/16 0511 04/07/16 1933 04/08/16 0043  NA 139 137 137  K 4.5 5.6* 6.3*  CL 100* 104 112*  CO2 30 26 19*  BUN 20 26* 25*  CREATININE 3.57* 4.37* 3.94*  GLUCOSE 221* 115* 284*    Electrolytes  Recent Labs Lab 04/05/16 0511 04/07/16 1933 04/08/16 0043  CALCIUM 7.8* 7.5* 5.5*    CBC  Recent Labs Lab 04/04/16 1610  04/05/16 0511 04/05/16 1412 04/07/16 1933 04/08/16 0043  WBC 5.9  --  4.0  --  6.7  --   HGB 9.3*  < > 8.5* 8.5* 5.3* 9.2*  HCT 28.4*  --  26.7*  --  16.5* 27.1*  PLT 352  --  300  --  317  --   < > = values in this interval not displayed.  Coag's  Recent Labs Lab 04/04/16 0500  APTT 31  INR 1.27    Sepsis Markers No results for input(s): LATICACIDVEN, PROCALCITON, O2SATVEN in the last 168 hours.  ABG No results for input(s): PHART, PCO2ART, PO2ART in the last 168 hours.  Liver Enzymes  Recent Labs Lab 04/04/16 0438 04/07/16 1933  AST 24 20  ALT 9* 7*  ALKPHOS 106 65  BILITOT 0.8 0.5  ALBUMIN 3.1* 2.6*    Cardiac Enzymes No results for input(s): TROPONINI, PROBNP in the last 168 hours.  Glucose  Recent Labs Lab 04/04/16 2157 04/05/16 0724 04/05/16 1121 04/05/16 1626 04/07/16 2205 04/07/16 2258  GLUCAP 184* 238* 160* 103* 96 149*    Imaging Dg Chest Portable 1  View  Result Date: 04/08/2016 CLINICAL DATA:  41 year old female with recurrent GI bleed. EXAM: PORTABLE CHEST 1 VIEW COMPARISON:  Chest radiograph dated 07/03/2014 FINDINGS: The patient is slightly tilted to the left. There is a left IJ dialysis catheter with tip over the right cardiac silhouette. There is mild cardiomegaly. There is asymmetric left lung interstitial prominence, likely atelectatic changes. No focal consolidation, pleural effusion, or pneumothorax. No acute osseous pathology. IMPRESSION: Mild cardiomegaly. Mild asymmetric left lung diffuse interstitial prominence, likely atelectatic changes. No focal consolidation. Electronically Signed   By: Elgie Collard M.D.   On: 04/08/2016 01:33    STUDIES:  Bleeding scan pending  CULTURES: None  ANTIBIOTICS: None  SIGNIFICANT EVENTS: 04/04/2016: Discharge following hospitalization for rectal bleeding 04/07/2016: ` ED with bright red blood per rectum; unclear etiology  LINES/TUBES: Right subclavian central venous catheter  DISCUSSION: 41 year old Caucasian female presenting with acute lower GI bleed of unknown etiology, acute blood loss anemia and hyperkalemia.  ASSESSMENT  Acute lower GI bleed. Acute blood loss anemia. Hyperkalemia. Nausea and vomiting. Hypotension-resolved with transfusion and IV fluids End-stage renal failure on hemodialysis. Type 1 diabetes on an insulin pump. Hypocalcemia Plan Rapid transfusion of packed red blood cells. Rapid transfusion of fresh frozen plasma. Aggressive IV fluid resuscitation Patient is awaiting transfer to [facility] Disposition: Care transitioned to the ED team. Awaiting transfer to [facility] of care and disposition discussed with Dr. Vassie Loll from [facility] and and covering attending, Dr. Lonn Georgia.  Magdalene S. [facility] ANP-BC Pulmonary and Critical Care Medicine [facility] Pager [phone] or [phone] 04/08/2016, 2:13 AM

## 2016-04-08 NOTE — ED Notes (Signed)
Pt has continued to bleed approx 2 L of blood in past 45 minutes to an hour.  Pt appearing more coherent at this time.  Dressing to central line changed and secured down.

## 2016-04-08 NOTE — ED Provider Notes (Addendum)
[facility] Emergency Department Provider Note  ____________________________________________   First MD Initiated Contact with Patient 04/07/16 2023     (approximate)  I have reviewed the triage vital signs and the nursing notes.   HISTORY  Chief Complaint GI Bleeding    HPI [name] is a 41 y.o. female who has a history of failed kidney pancreas transplant on and is on dialysis for end-stage renal disease. Has had bright red blood per rectum in December and had an upper and lower endoscopy and CT angiogram of which failed to show the etiology of the bleeding. Patient had at least one if not 2 other episodes of bleeding the last one being on the 19th of this month. On the 19th she was seen here she was stable and sent home. She began having bright red blood per rectum today with crampy abdominal pain and came to the emergency room on arrival here she had no further bright red blood per rectum which was very pale and her hemoglobin was 5 transfusion was ordered and begun. patient was going to be admitted when she began having more bright red blood per rectum again. Patient's pulse went up blood pressure dropped and she passed possibly 2 L of blood per rectum. Transfusion was speeded up another unit was started in her in the IV and her elbow and I came in to put a central line in her. I attempted a supraclavicular approach but was unable to obtain access so I then attempted the subclavicular approach and obtained access there. Blood transfusion was switched to there and patient received through the level I transfuser and posterior bed with a peripheral heater I believe it was 6 units of packed red cells and 2 units of FFP. At 1 AM patient appears to be stable bleeding seems to have stopped cramps are much better patient's blood pressure and pulse of stabilized patient's color is come back she is getting one more unit of FFP and 1 more unit of packed cells to be  transfused over 2 hours this can be easily speeded up if need be a repeat H&H is been sent. Patient is having the bear hugger on her Foley was inserted with temperature probe to monitor her temperature temperature was stable at 97 however patient was unable to tolerate the Foley so that was removed as the patient is apparently at least temporarily now stable we should be able to monitor her temperature orally.   Past Medical History:  Diagnosis Date  . Allergy   . Anemia   . Anxiety   . Blood transfusion without reported diagnosis   . Cataract   . Diabetes mellitus without complication (HCC)    history of/had transplant/tn  . ESRD on dialysis [facility]) 04/08/2011   Kidney transplant  . Heart murmur   . Hypertension   . Seizures [facility])     Patient Active Problem List   Diagnosis Date Noted  . GI bleed 04/07/2016  . BRBPR (bright red blood per rectum) 04/04/2016  . Hyperkalemia 01/18/2016  . ESRD on dialysis (HCC) 01/18/2016  . Diabetes (HCC) 01/18/2016  . Seizures (HCC) 01/18/2016  . Anxiety 01/18/2016  . Cervicitis 02/09/2014  . Pelvic pain in female 02/09/2014    Past Surgical History:  Procedure Laterality Date  . catheter surgery for dialysis  2011  . catheter surgery for dialysis  2012  . COMBINED KIDNEY-PANCREAS TRANSPLANT  2013  . ELBOW SURGERY  2009  . FEMUR SURGERY  2001   rod removed  . left fem  1999   rod in left femur  . REMOVAL OF A DIALYSIS CATHETER  2012   removed due to infection/tn  . ulna surgery  1999   plate put in arm  . WRIST SURGERY  2006   right wrist broken    Prior to Admission medications   Medication Sig Start Date End Date Taking? Authorizing Provider  acetaminophen (TYLENOL) 500 MG tablet Take 500 mg by mouth every 6 (six) hours as needed.   Yes Historical Provider, MD  amLODipine (NORVASC) 10 MG tablet Take 10 mg by mouth daily.   Yes Historical Provider, MD  cetirizine (ZYRTEC ALLERGY) 10 MG tablet Take 10 mg by mouth daily.   Yes  Historical Provider, MD  epoetin alfa (EPOGEN,PROCRIT) 16109 UNIT/ML injection Inject 10,000 Units into the vein 3 (three) times a week. During dialysis on Tues, thurs, sat   Yes Historical Provider, MD  labetalol (NORMODYNE) 200 MG tablet Take 1 tablet by mouth 3 (three) times daily. 01/29/15  Yes Historical Provider, MD  LamoTRIgine (LAMICTAL XR) 100 MG TB24 Take 300 mg by mouth daily.   Yes Historical Provider, MD  levonorgestrel (MIRENA) 20 MCG/24HR IUD 1 Intra Uterine Device (1 each total) by Intrauterine route once. Will place in clinic 03/03/12  Yes Reva Bores, MD  meclizine (ANTIVERT) 25 MG tablet Take 1 tablet by mouth 2 (two) times daily. 04/03/16  Yes Historical Provider, MD  NOVOLOG 100 UNIT/ML injection Inject 0-30 Units into the skin daily. Via insulin pump   Yes Historical Provider, MD  omeprazole (PRILOSEC) 20 MG capsule Take 20 mg by mouth daily.   Yes Historical Provider, MD  ondansetron (ZOFRAN) 4 MG tablet Take 4 mg by mouth every 8 (eight) hours as needed.   Yes Historical Provider, MD  oxyCODONE (OXY IR/ROXICODONE) 5 MG immediate release tablet Take 5 mg by mouth every 4 (four) hours as needed for severe pain.   Yes Historical Provider, MD  promethazine (PHENERGAN) 12.5 MG tablet Take 12.5 mg by mouth.   Yes Historical Provider, MD  rizatriptan (MAXALT) 10 MG tablet Take 10 mg by mouth as needed. May repeat in 2 hours if needed   Yes Historical Provider, MD  sevelamer carbonate (RENVELA) 800 MG tablet Take 1,600 mg by mouth 3 (three) times daily with meals.   Yes Historical Provider, MD  tacrolimus (PROGRAF) 1 MG capsule Take 3 mg by mouth 2 (two) times daily.    Yes Historical Provider, MD    Allergies Clarithromycin; Enoxaparin sodium; Entex t [pseudoephedrine-guaifenesin]; Levetiracetam; Quinine derivatives; Ciprofloxacin; Keppra [levetiracetam]; Latex; Naproxen sodium; Nsaids; and Scopolamine  Family History  Problem Relation Age of Onset  . Heart disease Father   .  Diabetes Father   . Hypertension Maternal Grandmother   . Stroke Maternal Grandmother   . Hypertension Maternal Grandfather   . Heart disease Paternal Grandmother   . Hypertension Paternal Grandmother   . Hypertension Paternal Grandfather   . Thyroid disease Mother   . Hypertension Mother   . Kidney disease Mother   . Cancer Maternal Aunt     Social History Social History  Substance Use Topics  . Smoking status: Never Smoker  . Smokeless tobacco: Never Used  . Alcohol use No    Review of Systems Constitutional: No fever/chills Eyes: No visual changes. ENT: No sore throat. Cardiovascular: Denies chest pain. Respiratory: Denies shortness of breath. Gastrointestinal:See history of present illness Genitourinary: Negative for dysuriaPatient makes  no urine. Musculoskeletal: Negative for back pain. Skin: Negative for rash. Neurological: Negative for headaches, focal weakness or numbness.  10-point ROS otherwise negative.  ____________________________________________   PHYSICAL EXAM:  VITAL SIGNS: ED Triage Vitals  Enc Vitals Group     BP 04/07/16 2026 137/76     Pulse Rate 04/07/16 1930 82     Resp 04/07/16 1930 20     Temp 04/07/16 1930 97.9 F (36.6 C)     Temp Source 04/07/16 1930 Oral     SpO2 04/07/16 1930 100 %     Weight 04/07/16 1931 123 lb (55.8 kg)     Height 04/07/16 1931 5\' 4"  (1.626 m)     Head Circumference --      Peak Flow --      Pain Score 04/07/16 2028 0     Pain Loc --      Pain Edu? --      Excl. in GC? --     Constitutional: Alert and oriented. Very pale but in no acute distress. Eyes: Conjunctivae are they'll. PERRL. EOMI. Head: Atraumatic. Nose: No congestion/rhinnorhea. Mouth/Throat: Mucous membranes are moist and pale.  Oropharynx non-erythematous. Neck: No stridor.  Cardiovascular: Tachycardic rate, regular rhythm. Grossly normal heart sounds.  Good peripheral circulation. Respiratory: Normal respiratory effort.  No retractions.  Lungs CTAB. Gastrointestinal: Soft and nontender. No distention. No abdominal bruits. No CVA tenderness. Musculoskeletal: No lower extremity tenderness nor edema.  No joint effusions. Neurologic:  Normal speech and language. No gross focal neurologic deficits are appreciated. No gait instability. Skin:  Skin is warm, dry and intact. No rash noted. Patient's skin is pale   ____________________________________________   LABS (all labs ordered are listed, but only abnormal results are displayed)  Labs Reviewed  COMPREHENSIVE METABOLIC PANEL - Abnormal; Notable for the following:       Result Value   Potassium 5.6 (*)    Glucose, Bld 115 (*)    BUN 26 (*)    Creatinine, Ser 4.37 (*)    Calcium 7.5 (*)    Total Protein 5.4 (*)    Albumin 2.6 (*)    ALT 7 (*)    GFR calc non Af Amer 12 (*)    GFR calc Af Amer 14 (*)    All other components within normal limits  CBC - Abnormal; Notable for the following:    RBC 1.73 (*)    Hemoglobin 5.3 (*)    HCT 16.5 (*)    RDW 24.4 (*)    All other components within normal limits  GLUCOSE, CAPILLARY - Abnormal; Notable for the following:    Glucose-Capillary 149 (*)    All other components within normal limits  GLUCOSE, CAPILLARY  HEMOGLOBIN AND HEMATOCRIT, BLOOD  POC OCCULT BLOOD, ED  TYPE AND SCREEN  PREPARE RBC (CROSSMATCH)  PREPARE RBC (CROSSMATCH)  PREPARE FRESH FROZEN PLASMA  PREPARE FRESH FROZEN PLASMA  PREPARE PLATELET PHERESIS   ____________________________________________  EKG   ____________________________________________  RADIOLOGY   ____________________________________________   PROCEDURES  Procedure(s) performed:Central line inserted consent obtained orally patient prepped and draped in sterile fashion quickly central line inserted as noted in history of present illness however this was done very rapidly and I was unable to maintain the most sterile of techniques. I recommend this line be changed soon as  possible  Procedures  Critical Care performed: Critical care time 2 hours and includes time spent supervising all the blood transfusions  ____________________________________________   INITIAL IMPRESSION / ASSESSMENT AND  PLAN / ED COURSE  Pertinent labs & imaging results that were available during my care of the patient were reviewed by me and considered in my medical decision making (see chart for details).        ____________________________________________   FINAL CLINICAL IMPRESSION(S) / ED DIAGNOSES  Final diagnoses:  Bleeding  Gastrointestinal hemorrhage, unspecified gastrointestinal hemorrhage type  Encounter for central line placement      NEW MEDICATIONS STARTED DURING THIS VISIT:  New Prescriptions   No medications on file     Note:  This document was prepared using Dragon voice recognition software and may include unintentional dictation errors.    Arnaldo Natal, MD 04/08/16 0114  At 210 patient's bleeding again blood pressures falling out of increased rate of FFP will increase rate of packed cells as well pressure more standing by nurse practitioner who is here from ICU team advises she thinks it is best to get the patient out of here I concur have called [facility] been accepted to the ER by ER attending they're checking on Advil bit of air transport   Arnaldo Natal, MD 04/08/16 0214 Repeat EKG after the potassium comes back at 6.3 shows sinus rhythm at 82 T waves again higher and more peaked. This is when I order the albuterol.   Arnaldo Natal, MD 04/08/16 0234 It is now 258 a.m. I have just given report to the helicopter crew mentioning the potassium and the fact that she started bleeding again etc. critical care time is now up to 3.5 hours at this point adding 1.5 extra hours.   Arnaldo Natal, MD 04/08/16 5784    Arnaldo Natal, MD 04/08/16 [phone] EKG was repeated after the neb before bicarbonate T-wave and T waves are still very peaky will give  her bicarbonate and then insulin glucose and calcium and get her the heck out of here.   Arnaldo Natal, MD 04/08/16 [phone]

## 2016-04-08 NOTE — Code Documentation (Signed)
Rapid infusion  Verbal order given by Dr. Darnelle CatalanMalinda with staff in room.

## 2016-04-08 NOTE — Code Documentation (Signed)
3rd unit of RBC given in [facility] at 2341

## 2016-04-08 NOTE — Code Documentation (Signed)
[facility]

## 2016-04-08 NOTE — ED Notes (Signed)
D50, insulin and calcium gluconate given by Dr. Darnelle CatalanMalinda prior to departure.

## 2016-04-08 NOTE — ED Notes (Signed)
[facility] get [facility].  [facility].  Approx 1 L of blood under patient during change.

## 2016-04-08 NOTE — ED Notes (Signed)
7th unit of blood hung at this time.  Unit #[id number].  Hung by April Brumgardner, RN and verified by Tula NakayamaKate Bumgarner, RN.

## 2016-04-09 LAB — TYPE AND SCREEN
ABO/RH(D): O POS
ANTIBODY SCREEN: NEGATIVE
UNIT DIVISION: 0
UNIT DIVISION: 0
UNIT DIVISION: 0
UNIT DIVISION: 0
UNIT DIVISION: 0
Unit division: 0
Unit division: 0
Unit division: 0
Unit division: 0
Unit division: 0
Unit division: 0

## 2016-04-09 LAB — PREPARE FRESH FROZEN PLASMA
UNIT DIVISION: 0
UNIT DIVISION: 0
Unit division: 0
Unit division: 0

## 2016-04-09 NOTE — Discharge Summary (Signed)
[facility] - [place] at [facility]   PATIENT NAME: [name]    MR#:  [id number]  DATE OF BIRTH:  1976/01/03  DATE OF ADMISSION:  04/04/2016 ADMITTING PHYSICIAN: Adrian Saran, MD  DATE OF DISCHARGE: 04/05/2016  7:58 PM  PRIMARY CARE PHYSICIAN: ANDERSON,TERESA, FNP    ADMISSION DIAGNOSIS:  Bright red blood per rectum [K62.5]  DISCHARGE DIAGNOSIS:  Active Problems:   BRBPR (bright red blood per rectum)   SECONDARY DIAGNOSIS:   Past Medical History:  Diagnosis Date  . Allergy   . Anemia   . Anxiety   . Blood transfusion without reported diagnosis   . Cataract   . Diabetes mellitus without complication (HCC)    history of/had transplant/tn  . ESRD on dialysis [facility]) 04/08/2011   Kidney transplant  . Heart murmur   . Hypertension   . Seizures [facility])     HOSPITAL COURSE:   41 year old female with ESRD on hemodialysis and renal transplant he was admitted at the end of December for bright red blood per rectum who presents again for red blood per rectum  1. Bright red blood per rectum with recent colonoscopy at [facility] that did not reveal source of GI bleed. Patient will likely need capsule endoscopy. Stable Follow hemoglobin for 24 hrs, no more BM with Blood,a dn tolerated diet. She was advised to follow at [facility].   2. Acute blood loss anemia: Discharge [facility] 8.8 [facility] Continue to monitor hemoglobin every 8 hours. Continue PPI   Stable.  3. ESRD on hemodialysis Monday, Tuesday, Thursday Saturday status post failed kidney transplant ESRD is due to type 1 diabetes Nephrology consultation appreciated.  4. Accelerated Essential hypertension: Patient has not received dialysis her medications morning Continue Norvasc and labetalol  5. Seizure disorder on Lamictal  6. Status post renal transplant on Prograf  7. Diabetes: Sliding scale insulin  DISCHARGE CONDITIONS:    Stable.  CONSULTS OBTAINED:  Treatment Team:  Lamont Dowdy, MD Lacey Jensen, MD  DRUG ALLERGIES:   Allergies  Allergen Reactions  . Clarithromycin Rash    Can not take due to transplant  . Enoxaparin Sodium Rash  . Entex T [Pseudoephedrine-Guaifenesin] Rash  . Levetiracetam Other (See Comments)    Constant Migraine  . Quinine Derivatives Rash    vomitting  . Ciprofloxacin Other (See Comments)    tingling  . Keppra [Levetiracetam] Other (See Comments)    Headache  . Latex     ONLY CONDOMS  . Naproxen Sodium Other (See Comments)    Ate lining of stomach/ Can not take due to transplant/tn  . Nsaids     Can not take due to kidney transplant/tn  . Scopolamine Rash    DISCHARGE MEDICATIONS:   Discharge Medication List as of 04/05/2016  6:07 PM    CONTINUE these medications which have NOT CHANGED   Details  acetaminophen (TYLENOL) 500 MG tablet Take 500 mg by mouth every 6 (six) hours as needed., Historical Med    amLODipine (NORVASC) 10 MG tablet Take 10 mg by mouth daily., Historical Med    cetirizine (ZYRTEC ALLERGY) 10 MG tablet Take 10 mg by mouth daily., Historical Med    labetalol (NORMODYNE) 200 MG tablet Take 1 tablet by mouth 3 (three) times daily., Starting 01/29/2015, Until Discontinued, Historical Med    LamoTRIgine (LAMICTAL XR) 100 MG TB24 Take 300 mg by mouth daily., Until Discontinued, Historical Med    levonorgestrel (MIRENA)  20 MCG/24HR IUD 1 Intra Uterine Device (1 each total) by Intrauterine route once. Will place in clinic, Starting 03/03/2012, No Print    NOVOLOG 100 UNIT/ML injection Inject 0-30 Units into the skin daily. Via insulin pump, Historical Med    omeprazole (PRILOSEC) 20 MG capsule Take 20 mg by mouth daily., Until Discontinued, Historical Med    ondansetron (ZOFRAN) 4 MG tablet Take 4 mg by mouth every 8 (eight) hours as needed., Until Discontinued, Historical Med    oxyCODONE (OXY IR/ROXICODONE) 5 MG immediate release tablet  Take 5 mg by mouth every 4 (four) hours as needed for severe pain., Historical Med    promethazine (PHENERGAN) 12.5 MG tablet Take 12.5 mg by mouth., Historical Med    rizatriptan (MAXALT) 10 MG tablet Take 10 mg by mouth as needed. May repeat in 2 hours if needed, Until Discontinued, Historical Med    sevelamer carbonate (RENVELA) 800 MG tablet Take 1,600 mg by mouth 3 (three) times daily with meals., Historical Med    tacrolimus (PROGRAF) 1 MG capsule Take 3 mg by mouth 2 (two) times daily. , Historical Med         DISCHARGE INSTRUCTIONS:    Follow with [facility] as she was advised.  If you experience worsening of your admission symptoms, develop shortness of breath, life threatening emergency, suicidal or homicidal thoughts you must seek medical attention immediately by calling 911 or calling your MD immediately  if symptoms less severe.  You Must read complete instructions/literature along with all the possible adverse reactions/side effects for all the Medicines you take and that have been prescribed to you. Take any new Medicines after you have completely understood and accept all the possible adverse reactions/side effects.   Please note  You were cared for by a hospitalist during your hospital stay. If you have any questions about your discharge medications or the care you received while you were in the hospital after you are discharged, you can call the unit and asked to speak with the hospitalist on call if the hospitalist that took care of you is not available. Once you are discharged, your primary care physician will handle any further medical issues. Please note that NO REFILLS for any discharge medications will be authorized once you are discharged, as it is imperative that you return to your primary care physician (or establish a relationship with a primary care physician if you do not have one) for your aftercare needs so that they can reassess your need for  medications and monitor your lab values.    Today   CHIEF COMPLAINT:   Chief Complaint  Patient presents with  . Rectal Bleeding    HISTORY OF PRESENT ILLNESS:  Danissa Rundle  is a 41 y.o. female with a known history of Type 1 diabetes, ESRD on hemodialysis, kidney transplant and seizure disorder who presents with above complaint. Patient was actually admitted at the end of December at [facility] for GI bleed. She underwent colonoscopy which did not identify source of bleeding. She also had a CTA without signs of mesenteric ischemia or other source of bleeding. GI had concern for possible bowel ischemia at site of pancreas anastomosis or AVMs but due to her stable hgb they had elected for OP follow up which is scheduled for the next week. She denies abdominal pain, nausea, vomiting or fevers.   VITAL SIGNS:  Blood pressure 138/74, pulse 78, temperature 98.1 F (36.7 C), temperature source Oral,  resp. rate 18, height 5\' 4"  (1.626 m), weight 55.9 kg (123 lb 3.2 oz), SpO2 96 %.  I/O:  No intake or output data in the 24 hours ending 04/09/16 0912  PHYSICAL EXAMINATION:  GENERAL:  41 y.o.-year-old patient lying in the bed with no acute distress.  EYES: Pupils equal, round, reactive to light and accommodation. No scleral icterus. Extraocular muscles intact. Conjunctiva pale. HEENT: Head atraumatic, normocephalic. Oropharynx and nasopharynx clear.  NECK:  Supple, no jugular venous distention. No thyroid enlargement, no tenderness.  LUNGS: Normal breath sounds bilaterally, no wheezing, rales,rhonchi or crepitation. No use of accessory muscles of respiration.  CARDIOVASCULAR: S1, S2 normal. No murmurs, rubs, or gallops.  ABDOMEN: Soft, non-tender, non-distended. Bowel sounds present. No organomegaly or mass.  EXTREMITIES: No pedal edema, cyanosis, or clubbing.  NEUROLOGIC: Cranial nerves II through XII are intact. Muscle strength 5/5 in all extremities. Sensation  intact. Gait not checked.  PSYCHIATRIC: The patient is alert and oriented x 3.  SKIN: No obvious rash, lesion, or ulcer.   DATA REVIEW:   CBC  Recent Labs Lab 04/07/16 1933 04/08/16 0043  WBC 6.7  --   HGB 5.3* 9.2*  HCT 16.5* 27.1*  PLT 317  --     Chemistries   Recent Labs Lab 04/07/16 1933 04/08/16 0043  NA 137 137  K 5.6* 6.3*  CL 104 112*  CO2 26 19*  GLUCOSE 115* 284*  BUN 26* 25*  CREATININE 4.37* 3.94*  CALCIUM 7.5* 5.5*  AST 20  --   ALT 7*  --   ALKPHOS 65  --   BILITOT 0.5  --     Cardiac Enzymes No results for input(s): TROPONINI in the last 168 hours.  Microbiology Results  Results for orders placed or performed during the hospital encounter of 04/04/16  MRSA PCR Screening     Status: None   Collection Time: 04/04/16  6:40 PM  Result Value Ref Range Status   MRSA by PCR NEGATIVE NEGATIVE Final    Comment:        The GeneXpert MRSA Assay (FDA approved for NASAL specimens only), is one component of a comprehensive MRSA colonization surveillance program. It is not intended to diagnose MRSA infection nor to guide or monitor treatment for MRSA infections.     RADIOLOGY:  Dg Chest Portable 1 View  Result Date: 04/08/2016 CLINICAL DATA:  41 year old female with recurrent GI bleed. EXAM: PORTABLE CHEST 1 VIEW COMPARISON:  Chest radiograph dated 07/03/2014 FINDINGS: The patient is slightly tilted to the left. There is a left IJ dialysis catheter with tip over the right cardiac silhouette. There is mild cardiomegaly. There is asymmetric left lung interstitial prominence, likely atelectatic changes. No focal consolidation, pleural effusion, or pneumothorax. No acute osseous pathology. IMPRESSION: Mild cardiomegaly. Mild asymmetric left lung diffuse interstitial prominence, likely atelectatic changes. No focal consolidation. Electronically Signed   By: Elgie CollardArash  Radparvar M.D.   On: 04/08/2016 01:33    EKG:   Orders placed or performed during the  hospital encounter of 04/04/16  . ED EKG  . ED EKG  . EKG 12-Lead  . EKG 12-Lead      Management plans discussed with the patient, family and they are in agreement.  CODE STATUS:  Code Status History    Date Active Date Inactive Code Status Order ID Comments User Context   04/04/2016  6:06 AM 04/05/2016 11:03 PM Full Code [id number]  Adrian SaranSital Mody, MD ED   01/19/2016 12:50 AM 01/19/2016  4:00  PM Full Code [id number]  Oralia Manis, MD Inpatient      TOTAL TIME TAKING CARE OF THIS PATIENT: 35 minutes.    Altamese Dilling M.D on 04/09/2016 at 9:12 AM  Between 7am to 6pm - Pager - [phone]  After 6pm go to [email or web address] - password [email or web address]  [facility]  Office  [phone]  CC: Primary care physician; Elizabeth Palau, FNP   Note: This dictation was prepared with Dragon dictation along with smaller phrase technology. Any transcriptional errors that result from this process are unintentional.

## 2016-04-17 LAB — PREPARE PLATELET PHERESIS
BLOOD PRODUCT EXPIRATION DATE: 201801242359
BLOOD PRODUCT EXPIRATION DATE: 201801242359
Blood Product Expiration Date: 201801252359
Unit Type and Rh: 6200
Unit Type and Rh: 6200
Unit Type and Rh: 6200

## 2017-06-13 ENCOUNTER — Ambulatory Visit: Payer: Medicare Other | Admitting: Obstetrics & Gynecology

## 2017-07-10 ENCOUNTER — Ambulatory Visit: Payer: Medicare Other | Admitting: Obstetrics & Gynecology

## 2017-08-01 ENCOUNTER — Encounter: Payer: Self-pay | Admitting: Obstetrics & Gynecology

## 2017-08-01 ENCOUNTER — Ambulatory Visit (INDEPENDENT_AMBULATORY_CARE_PROVIDER_SITE_OTHER): Payer: Medicare Other | Admitting: Obstetrics & Gynecology

## 2017-08-01 ENCOUNTER — Other Ambulatory Visit (HOSPITAL_COMMUNITY)
Admission: RE | Admit: 2017-08-01 | Discharge: 2017-08-01 | Disposition: A | Discharge status: Home / Self Care | Payer: Medicare Other | Source: Ambulatory Visit | Attending: Obstetrics & Gynecology | Admitting: Obstetrics & Gynecology

## 2017-08-01 ENCOUNTER — Other Ambulatory Visit: Payer: Self-pay | Admitting: Obstetrics & Gynecology

## 2017-08-01 VITALS — BP 124/74 | HR 80 | Ht 65.0 in | Wt 137.0 lb

## 2017-08-01 DIAGNOSIS — Z124 Encounter for screening for malignant neoplasm of cervix: Secondary | ICD-10-CM

## 2017-08-01 DIAGNOSIS — Z01419 Encounter for gynecological examination (general) (routine) without abnormal findings: Secondary | ICD-10-CM

## 2017-08-01 DIAGNOSIS — Z1231 Encounter for screening mammogram for malignant neoplasm of breast: Secondary | ICD-10-CM

## 2017-08-01 NOTE — Progress Notes (Signed)
Subjective:    [name] is a 42 y.o. single P9 (10 yo daughter) female who presents for an annual exam. She would like STI testing.The patient is not currently sexually active for the last 2 years.  GYN screening history: last pap: was normal. The patient wears seatbelts: yes. The patient participates in regular exercise: yes. Has the patient ever been transfused or tattooed?: yes. (transfused 72 units) The patient reports that there is not domestic violence in her life.   Menstrual History: OB History    Gravida  4   Para  1   Term      Preterm      AB      Living        SAB      TAB      Ectopic      Multiple      Live Births              Menarche age: 65 No LMP recorded. (Menstrual status: IUD).    The following portions of the patient's history were reviewed and updated as appropriate: allergies, current medications, past family history, past medical history, past social history, past surgical history and problem list.  Review of Systems Pertinent items are noted in HPI.   She is on disability She has already had 1 transplanted kidney but it is no longer working, she is on the list for another one. FH- no breast/gyn/colon cancer   Objective:    BP 124/74   Pulse 80   Ht  (1.651 m)   Wt 137 lb (62.1 kg)   BMI 22.80 kg/m   General Appearance:    Alert, cooperative, no distress, appears stated age  Head:    Normocephalic, without obvious abnormality, atraumatic  Eyes:    PERRL, conjunctiva/corneas clear, EOM's intact, fundi    benign, both eyes  Ears:    Normal TM's and external ear canals, both ears  Nose:   Nares normal, septum midline, mucosa normal, no drainage    or sinus tenderness  Throat:   Lips, mucosa, and tongue normal; teeth and gums normal  Neck:   Supple, symmetrical, trachea midline, no adenopathy;    thyroid:  no enlargement/tenderness/nodules; no carotid   bruit or JVD  Back:     Symmetric, no curvature, ROM normal, no CVA  tenderness  Lungs:     Clear to auscultation bilaterally, respirations unlabored  Chest Wall:    No tenderness or deformity   Heart:    Regular rate and rhythm, S1 and S2 normal, no murmur, rub   or gallop  Breast Exam:    No tenderness, masses, or nipple abnormality  Abdomen:     Soft, non-tender, bowel sounds active all four quadrants,    no masses, no organomegaly  Genitalia:    Normal female without lesion, discharge or tenderness, normal size and shape, anteverted, mobile, non-tender, normal adnexal exam, IUD strings seen, moderate amount of white discharge seen (non-specific)      Extremities:   Extremities normal, atraumatic, no cyanosis or edema  Pulses:   2+ and symmetric all extremities  Skin:   Skin color, texture, turgor normal, no rashes or lesions  Lymph nodes:   Cervical, supraclavicular, and axillary nodes normal  Neurologic:   CNII-XII intact, normal strength, sensation and reflexes    throughout  .    Assessment:    Healthy female exam.    Plan:     Mammogram.  Come back for Mirena replacement

## 2017-08-04 ENCOUNTER — Ambulatory Visit
Admission: RE | Admit: 2017-08-04 | Discharge: 2017-08-04 | Disposition: A | Discharge status: Home / Self Care | Payer: Medicare Other | Source: Ambulatory Visit | Attending: Obstetrics & Gynecology | Admitting: Obstetrics & Gynecology

## 2017-08-04 DIAGNOSIS — Z1231 Encounter for screening mammogram for malignant neoplasm of breast: Secondary | ICD-10-CM

## 2017-08-05 ENCOUNTER — Other Ambulatory Visit (HOSPITAL_COMMUNITY): Payer: Self-pay | Admitting: Obstetrics & Gynecology

## 2017-08-05 DIAGNOSIS — R928 Other abnormal and inconclusive findings on diagnostic imaging of breast: Secondary | ICD-10-CM

## 2017-08-07 ENCOUNTER — Other Ambulatory Visit: Payer: Self-pay | Admitting: Obstetrics & Gynecology

## 2017-08-07 ENCOUNTER — Ambulatory Visit
Admission: RE | Admit: 2017-08-07 | Discharge: 2017-08-07 | Disposition: A | Discharge status: Home / Self Care | Payer: Medicare Other | Source: Ambulatory Visit | Attending: Obstetrics & Gynecology | Admitting: Obstetrics & Gynecology

## 2017-08-07 ENCOUNTER — Ambulatory Visit: Admission: RE | Admit: 2017-08-07 | Payer: Medicare Other | Source: Ambulatory Visit

## 2017-08-07 DIAGNOSIS — R928 Other abnormal and inconclusive findings on diagnostic imaging of breast: Secondary | ICD-10-CM

## 2017-08-07 LAB — CYTOLOGY - PAP
ADEQUACY: ABSENT
BACTERIAL VAGINITIS: NEGATIVE
CANDIDA VAGINITIS: POSITIVE — AB
Chlamydia: NEGATIVE
Diagnosis: NEGATIVE
HPV: NOT DETECTED
Neisseria Gonorrhea: NEGATIVE
Trichomonas: NEGATIVE

## 2017-08-07 MED ORDER — FLUCONAZOLE 150 MG PO TABS: 150.0000 mg | ORAL_TABLET | Freq: Once | ORAL | 3 refills | Status: AC

## 2017-08-07 NOTE — Progress Notes (Signed)
Diflucan called in for yeast seen on pap smear. MyChart message sent to her.

## 2017-08-15 ENCOUNTER — Encounter: Payer: Self-pay | Admitting: Obstetrics & Gynecology

## 2017-08-15 ENCOUNTER — Ambulatory Visit (INDEPENDENT_AMBULATORY_CARE_PROVIDER_SITE_OTHER): Payer: Medicare Other | Admitting: Obstetrics & Gynecology

## 2017-08-15 VITALS — BP 117/75 | HR 80 | Wt 135.2 lb

## 2017-08-15 DIAGNOSIS — Z30433 Encounter for removal and reinsertion of intrauterine contraceptive device: Secondary | ICD-10-CM

## 2017-08-15 DIAGNOSIS — Z30430 Encounter for insertion of intrauterine contraceptive device: Secondary | ICD-10-CM

## 2017-08-15 MED ORDER — LEVONORGESTREL 19.5 MCG/DAY IU IUD
INTRAUTERINE_SYSTEM | Freq: Once | INTRAUTERINE | Status: AC
  Administered 2017-08-15: 10:00:00 via INTRAUTERINE

## 2017-08-15 NOTE — Progress Notes (Signed)
   Subjective:    Patient ID: Debra DoughtyJamie R [name], female    DOB: Mar 09, 1976, 42 y.o.   MRN: [id number]  HPI 42 yo single P1 here to have her newly expired Mirena replaced. She does not have periods. This will be her third progesterone secreting IUD.   Review of Systems     Objective:   Physical Exam  Breathing, conversing, and ambulating normally Well nourished, well hydrated White female, no apparent distress UPT negative, consent signed, Time out procedure done. Cervix prepped with betadine and grasped with a single tooth tenaculum. Liletta was easily placed and the strings were cut to 3-4 cm. Uterus sounded to 9 cm. She tolerated the procedure well.      Assessment & Plan:   Contraception- Penni BombardLiletta

## 2017-08-15 NOTE — Addendum Note (Signed)
Addended by: Allie BossierVE, Tradarius Reinwald C on: 08/15/2017 09:51 AM   Modules accepted: Level of Service

## 2017-09-26 ENCOUNTER — Ambulatory Visit (INDEPENDENT_AMBULATORY_CARE_PROVIDER_SITE_OTHER): Payer: Medicare Other | Admitting: Obstetrics & Gynecology

## 2017-09-26 ENCOUNTER — Encounter: Payer: Self-pay | Admitting: Obstetrics & Gynecology

## 2017-09-26 VITALS — BP 150/85 | HR 84 | Wt 139.4 lb

## 2017-09-26 DIAGNOSIS — L709 Acne, unspecified: Secondary | ICD-10-CM | POA: Diagnosis not present

## 2017-09-26 DIAGNOSIS — G43909 Migraine, unspecified, not intractable, without status migrainosus: Secondary | ICD-10-CM

## 2017-09-26 NOTE — Progress Notes (Signed)
   Subjective:    Patient ID: Devonne DoughtyJamie R Ruggerio, female    DOB: [birth year], 42 y.o.   MRN: [id number]  HPI  42 yo single P1 here with the issue of a return of her migraines and new onset acne since getting the Liletta placed. She has had Mirena for the last 10 years with none of these problems.  Review of Systems     Objective:   Physical Exam Breathing, conversing, and ambulating normally Well nourished, well hydrated White female, no apparent distress Moderate amount of facial acne     Assessment & Plan:  New onset acne and worsening of migraines since changing from [place] to [place] I have suggested that she wait another month and see if these symptoms resolve. If they don't resolve, then I would suggest changing back to Mirena.

## 2018-01-27 ENCOUNTER — Encounter (HOSPITAL_BASED_OUTPATIENT_CLINIC_OR_DEPARTMENT_OTHER): Payer: Medicare Other | Attending: Internal Medicine

## 2018-05-26 ENCOUNTER — Encounter: Payer: Self-pay | Admitting: Radiology

## 2019-02-02 ENCOUNTER — Other Ambulatory Visit: Payer: Self-pay | Admitting: Nephrology

## 2019-02-02 DIAGNOSIS — Z1231 Encounter for screening mammogram for malignant neoplasm of breast: Secondary | ICD-10-CM

## 2019-02-05 ENCOUNTER — Other Ambulatory Visit: Payer: Self-pay

## 2019-02-05 ENCOUNTER — Ambulatory Visit
Admission: RE | Admit: 2019-02-05 | Discharge: 2019-02-05 | Disposition: A | Discharge status: Home / Self Care | Payer: Medicare Other | Source: Ambulatory Visit | Attending: Nephrology | Admitting: Nephrology

## 2019-02-05 DIAGNOSIS — Z1231 Encounter for screening mammogram for malignant neoplasm of breast: Secondary | ICD-10-CM

## 2019-04-19 DEATH — deceased
